# Patient Record
Sex: Female | Born: 1995
Health system: Southern US, Community
[De-identification: ages and names within clinical notes are randomized; demographics above are authoritative.]

## PROBLEM LIST (undated history)

## (undated) DIAGNOSIS — F32A Depression, unspecified: Secondary | ICD-10-CM

## (undated) DIAGNOSIS — F419 Anxiety disorder, unspecified: Secondary | ICD-10-CM

## (undated) DIAGNOSIS — F329 Major depressive disorder, single episode, unspecified: Secondary | ICD-10-CM

## (undated) HISTORY — PX: TUMOR EXCISION: SHX421

---
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
## 2019-01-10 DIAGNOSIS — R5383 Other fatigue: Secondary | ICD-10-CM | POA: Diagnosis not present

## 2019-03-22 DIAGNOSIS — H5213 Myopia, bilateral: Secondary | ICD-10-CM | POA: Diagnosis not present

## 2019-06-09 DIAGNOSIS — Z20828 Contact with and (suspected) exposure to other viral communicable diseases: Secondary | ICD-10-CM | POA: Diagnosis not present

## 2019-08-24 ENCOUNTER — Encounter: Payer: Self-pay | Admitting: Emergency Medicine

## 2019-08-24 DIAGNOSIS — R1032 Left lower quadrant pain: Secondary | ICD-10-CM | POA: Diagnosis not present

## 2019-08-24 DIAGNOSIS — R109 Unspecified abdominal pain: Secondary | ICD-10-CM | POA: Diagnosis not present

## 2019-08-24 LAB — URINALYSIS, ROUTINE W REFLEX MICROSCOPIC
Bilirubin Urine: NEGATIVE
Glucose, UA: NEGATIVE mg/dL
Hgb urine dipstick: NEGATIVE
Ketones, ur: NEGATIVE mg/dL
Leukocytes,Ua: NEGATIVE
Nitrite: NEGATIVE
Protein, ur: NEGATIVE mg/dL
Specific Gravity, Urine: 1.015 (ref 1.005–1.030)
pH: 7.5 (ref 5.0–8.0)

## 2019-08-24 LAB — CBC
HCT: 40.8 % (ref 36.0–46.0)
Hemoglobin: 14 g/dL (ref 12.0–15.0)
MCH: 28.7 pg (ref 26.0–34.0)
MCHC: 34.3 g/dL (ref 30.0–36.0)
MCV: 83.6 fL (ref 80.0–100.0)
Platelets: 240 10*3/uL (ref 150–400)
RBC: 4.88 MIL/uL (ref 3.87–5.11)
RDW: 12.5 % (ref 11.5–15.5)
WBC: 13.1 10*3/uL — ABNORMAL HIGH (ref 4.0–10.5)
nRBC: 0 % (ref 0.0–0.2)

## 2019-08-24 LAB — COMPREHENSIVE METABOLIC PANEL
ALT: 64 U/L — ABNORMAL HIGH (ref 0–44)
AST: 53 U/L — ABNORMAL HIGH (ref 15–41)
Albumin: 4.9 g/dL (ref 3.5–5.0)
Alkaline Phosphatase: 55 U/L (ref 38–126)
Anion gap: 12 (ref 5–15)
BUN: 9 mg/dL (ref 6–20)
CO2: 23 mmol/L (ref 22–32)
Calcium: 8.9 mg/dL (ref 8.9–10.3)
Chloride: 102 mmol/L (ref 98–111)
Creatinine, Ser: 0.56 mg/dL (ref 0.44–1.00)
GFR calc Af Amer: >60 mL/min (ref >60)
GFR calc non Af Amer: >60 mL/min (ref >60)
Glucose, Bld: 103 mg/dL — ABNORMAL HIGH (ref 70–99)
Potassium: 4 mmol/L (ref 3.5–5.1)
Sodium: 137 mmol/L (ref 135–145)
Total Bilirubin: 0.7 mg/dL (ref 0.3–1.2)
Total Protein: 7.6 g/dL (ref 6.5–8.1)

## 2019-08-24 LAB — PREGNANCY, URINE: Preg Test, Ur: NEGATIVE

## 2019-08-24 LAB — LIPASE, BLOOD: Lipase: 23 U/L (ref 11–51)

## 2019-08-24 MED ORDER — SODIUM CHLORIDE 0.9% FLUSH
3.0000 mL | Freq: Once | INTRAVENOUS | Status: DC
  Filled 2019-08-24: qty 3

## 2019-08-24 NOTE — ED Triage Notes (Signed)
Pt is c/o lower abd pain x 1 week  Pt states the pain is worse tonight  Pt states the pain comes in waves  Pt has had 3 episodes of vomiting tonight

## 2019-08-25 ENCOUNTER — Ambulatory Visit (HOSPITAL_BASED_OUTPATIENT_CLINIC_OR_DEPARTMENT_OTHER)
Admit: 2019-08-25 | Discharge: 2019-08-25 | Disposition: A | Discharge status: Home / Self Care | Payer: BC Managed Care – PPO | Attending: Emergency Medicine | Admitting: Emergency Medicine

## 2019-08-25 ENCOUNTER — Other Ambulatory Visit: Payer: Self-pay

## 2019-08-25 ENCOUNTER — Emergency Department (HOSPITAL_BASED_OUTPATIENT_CLINIC_OR_DEPARTMENT_OTHER)
Admission: EM | Admit: 2019-08-25 | Discharge: 2019-08-25 | Disposition: A | Discharge status: Home / Self Care | Payer: BC Managed Care – PPO | Attending: Emergency Medicine | Admitting: Emergency Medicine

## 2019-08-25 DIAGNOSIS — R1032 Left lower quadrant pain: Secondary | ICD-10-CM | POA: Diagnosis not present

## 2019-08-25 HISTORY — DX: Depression, unspecified: F32.A

## 2019-08-25 HISTORY — DX: Anxiety disorder, unspecified: F41.9

## 2019-08-25 MED ORDER — ONDANSETRON 4 MG PO TBDP
8.0000 mg | ORAL_TABLET | Freq: Once | ORAL | Status: AC
  Administered 2019-08-25: 8 mg via ORAL
  Filled 2019-08-25: qty 2

## 2019-08-25 MED ORDER — ONDANSETRON 8 MG PO TBDP: 8.0000 mg | ORAL_TABLET | Freq: Three times a day (TID) | ORAL | 0 refills | Status: DC | PRN

## 2019-08-25 NOTE — ED Provider Notes (Signed)
In the  [facility] Provider Note: Crystal Dell, MD, FACEP  CSN: [id number] MRN: [id number] ARRIVAL: 08/24/19 at 2242 ROOM: MH04/MH04   CHIEF COMPLAINT  Abdominal Pain   HISTORY OF PRESENT ILLNESS  08/25/19 3:22 AM Crystal Marshall is a 24 y.o. female who has had lower abdominal pain for about a week.  The pain is sometimes dull and sometimes all the way across her lower abdomen.  She describes it as being constant but crampy in nature.  Presently it is more per pronounced on the left side.  It is worse with movement or palpation.  She describes it as a soreness as well.  She has had associated nausea, worse when she eats or drinks, and has had 3 episodes of vomiting.  She denies diarrhea, dysuria, hematuria, vaginal bleeding or discharge.   Past Medical History:  Diagnosis Date  . Anxiety   . Depression     History reviewed. No pertinent surgical history.  Family History  Problem Relation Age of Onset  . Hypertension Mother   . Diabetes Father   . Hypertension Father   . Hyperlipidemia Father   . Diabetes Other     Social History   Tobacco Use  . Smoking status: Never Smoker  . Smokeless tobacco: Never Used  Substance Use Topics  . Alcohol use: Yes  . Drug use: Yes    Types: Marijuana    Prior to Admission medications   Medication Sig Start Date End Date Taking? Authorizing Provider  ondansetron (ZOFRAN ODT) 8 MG disintegrating tablet Take 1 tablet (8 mg total) by mouth every 8 (eight) hours as needed for nausea or vomiting. 08/25/19   Desteny Freeman, Jonny Ruiz, MD    Allergies Patient has no known allergies.   REVIEW OF SYSTEMS  Negative except as noted here or in the History of Present Illness.   PHYSICAL EXAMINATION  Initial Vital Signs Blood pressure 126/79, pulse 98, temperature 98.3 F (36.8 C), temperature source Oral, resp. rate 18, height 5' 4.5" (1.638 m), weight 93 kg, last menstrual period 08/10/2019, SpO2 99 %.  Examination General:  Well-developed, well-nourished female in no acute distress; appearance consistent with age of record HENT: normocephalic; atraumatic Eyes: pupils equal, round and reactive to light; extraocular muscles intact Neck: supple Heart: regular rate and rhythm Lungs: clear to auscultation bilaterally Abdomen: soft; nondistended; left lower quadrant tenderness; bowel sounds present Extremities: No deformity; full range of motion; pulses normal Neurologic: Awake, alert and oriented; motor function intact in all extremities and symmetric; no facial droop Skin: Warm and dry Psychiatric: Normal mood and affect   RESULTS  Summary of this visit's results, reviewed and interpreted by myself:   EKG Interpretation  Date/Time:    Ventricular Rate:    PR Interval:    QRS Duration:   QT Interval:    QTC Calculation:   R Axis:     Text Interpretation:        Laboratory Studies: Results for orders placed or performed during the hospital encounter of 08/25/19 (from the past 24 hour(s))  Lipase, blood     Status: None   Collection Time: 08/24/19 11:09 PM  Result Value Ref Range   Lipase 23 11 - 51 U/L  Comprehensive metabolic panel     Status: Abnormal   Collection Time: 08/24/19 11:09 PM  Result Value Ref Range   Sodium 137 135 - 145 mmol/L   Potassium 4.0 3.5 - 5.1 mmol/L   Chloride 102 98 - 111 mmol/L  CO2 23 22 - 32 mmol/L   Glucose, Bld 103 (H) 70 - 99 mg/dL   BUN 9 6 - 20 mg/dL   Creatinine, Ser 0.56 0.44 - 1.00 mg/dL   Calcium 8.9 8.9 - 10.3 mg/dL   Total Protein 7.6 6.5 - 8.1 g/dL   Albumin 4.9 3.5 - 5.0 g/dL   AST 53 (H) 15 - 41 U/L   ALT 64 (H) 0 - 44 U/L   Alkaline Phosphatase 55 38 - 126 U/L   Total Bilirubin 0.7 0.3 - 1.2 mg/dL   GFR calc non Af Amer >60 >60 mL/min   GFR calc Af Amer >60 >60 mL/min   Anion gap 12 5 - 15  CBC     Status: Abnormal   Collection Time: 08/24/19 11:09 PM  Result Value Ref Range   WBC 13.1 (H) 4.0 - 10.5 K/uL   RBC 4.88 3.87 - 5.11 MIL/uL    Hemoglobin 14.0 12.0 - 15.0 g/dL   HCT 40.8 36.0 - 46.0 %   MCV 83.6 80.0 - 100.0 fL   MCH 28.7 26.0 - 34.0 pg   MCHC 34.3 30.0 - 36.0 g/dL   RDW 12.5 11.5 - 15.5 %   Platelets 240 150 - 400 K/uL   nRBC 0.0 0.0 - 0.2 %  Urinalysis, Routine w reflex microscopic     Status: None   Collection Time: 08/24/19 11:09 PM  Result Value Ref Range   Color, Urine YELLOW YELLOW   APPearance CLEAR CLEAR   Specific Gravity, Urine 1.015 1.005 - 1.030   pH 7.5 5.0 - 8.0   Glucose, UA NEGATIVE NEGATIVE mg/dL   Hgb urine dipstick NEGATIVE NEGATIVE   Bilirubin Urine NEGATIVE NEGATIVE   Ketones, ur NEGATIVE NEGATIVE mg/dL   Protein, ur NEGATIVE NEGATIVE mg/dL   Nitrite NEGATIVE NEGATIVE   Leukocytes,Ua NEGATIVE NEGATIVE  Pregnancy, urine     Status: None   Collection Time: 08/24/19 11:09 PM  Result Value Ref Range   Preg Test, Ur NEGATIVE NEGATIVE   Imaging Studies: No results found.  ED COURSE and MDM  Nursing notes, initial and subsequent vitals signs, including pulse oximetry, reviewed and interpreted by myself.  Vitals:   08/24/19 2256 08/24/19 2257 08/25/19 0133  BP: (!) 114/91  126/79  Pulse: 96  98  Resp: 18  18  Temp: 98.9 F (37.2 C)  98.3 F (36.8 C)  TempSrc: Oral  Oral  SpO2: 100%  99%  Weight:  93 kg   Height:  5' 4.5" (1.638 m)    Medications  ondansetron (ZOFRAN-ODT) disintegrating tablet 8 mg (has no administration in time range)    The differential diagnosis of left lower quadrant pain in a young female is heavily weighted towards the reproductive organs.  We will start with a pelvic ultrasound later this morning.  If this does not yield an answer we may proceed to CT scan but the patient was advised that we wish to avoid radiation in a patient her age.  She does have a remote history of diverticulitis, which is a typical for her age, but states her current pain is not like that of previous diverticulitis.  She is comfortable with this current plan.  PROCEDURES    Procedures   ED DIAGNOSES     ICD-10-CM   1. Left lower quadrant abdominal pain  R10.32        Shanon Rosser, MD 08/25/19 [phone]

## 2019-08-25 NOTE — ED Provider Notes (Signed)
Patient had a follow-up ultrasound per Dr. Read Drivers' order from last night's visit.  Results reviewed with patient.  Left ovarian cyst identified on ultrasound.  Review of chart indicates this is consistent with patient's pain pattern.  Patient is counseled that she must have follow-up with GYN.  She is advised to use her discharge instructions with referral number or go to [facility] health website.  Return precautions reviewed for worsening pain, fever, other concerning symptoms.  Recommendations made for over-the-counter Tylenol or ibuprofen for pain control.  Marland Kitchen   Arby Barrette, MD 08/25/19 [phone]

## 2019-09-27 ENCOUNTER — Telehealth (HOSPITAL_COMMUNITY): Payer: BC Managed Care – PPO | Admitting: Psychiatry

## 2019-09-27 ENCOUNTER — Other Ambulatory Visit: Payer: Self-pay

## 2019-09-27 ENCOUNTER — Encounter (HOSPITAL_COMMUNITY): Payer: Self-pay | Admitting: Psychiatry

## 2019-09-27 ENCOUNTER — Telehealth (INDEPENDENT_AMBULATORY_CARE_PROVIDER_SITE_OTHER): Payer: BC Managed Care – PPO | Admitting: Psychiatry

## 2019-09-27 DIAGNOSIS — F411 Generalized anxiety disorder: Secondary | ICD-10-CM | POA: Diagnosis not present

## 2019-09-27 DIAGNOSIS — F330 Major depressive disorder, recurrent, mild: Secondary | ICD-10-CM

## 2019-09-27 MED ORDER — FLUVOXAMINE MALEATE 100 MG PO TABS: 100.0000 mg | ORAL_TABLET | Freq: Every day | ORAL | 1 refills | Status: DC

## 2019-09-27 MED ORDER — LAMOTRIGINE 200 MG PO TABS: 200.0000 mg | ORAL_TABLET | Freq: Every day | ORAL | 1 refills | Status: DC

## 2019-09-27 MED ORDER — MELATONIN 5 MG PO CAPS: 5.0000 mg | ORAL_CAPSULE | Freq: Every day | ORAL | 1 refills | Status: DC

## 2019-09-27 NOTE — Progress Notes (Signed)
Psychiatric Initial Adult Assessment  Virtual Visit via Video Note  I connected with Crystal Marshall on 09/27/19 at  3:00 PM EDT by a video enabled telemedicine application and verified that I am speaking with the correct person using two identifiers.  Location: Patient: Home Provider: Clinic   I discussed the limitations of evaluation and management by telemedicine and the availability of in person appointments. The patient expressed understanding and agreed to proceed.  I provided 40 minutes of non-face-to-face time during this encounter.    Patient Identification: Crystal Marshall MRN:  [id number] Date of Evaluation:  09/27/2019 Referral Source: Beverly Sessions  Chief Complaint: "I feel depressed"     Visit Diagnosis: No diagnosis found.  History of Present Illness:  24 year old female was seen today for initial psychiatric evaluation. Patient was referred to outpatient psychiatry by [facility]. She has a psychiatic history of anxiety and depression. She is currently prescribed Lamictal 200 mg and melatonin 5 mg. Today she reports that she has been feeling depressed. She endorses depressed mood most days, weight gain, and crying. Currently she is on her menstrual cycle and reports that she cries at times and notes that her mood fluctuates.  She reports at times she feels distractible and impulsive.  She reports that 2 weeks ago she stayed up all night cleaning and at times is unable to complete task because of distractibility.  She denies grandiosity, flight of ideas, sleep disturbances, or excessive talking.  She reports that her medications are effective in managing her mood at this time and did not request medication adjustments.  She states that she has had a lot of stressors to occur this month including her cat being sick and the semester ending. 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 She  reports that she and her professors will make surveys for local tobacco vendors to determine it they would consider selling Nicotine Replacement Therapy (NRT).  In August she reports that she will start a new semester on campus.  She believes that this may be a stressful time and reports that she will consider starting therapy to help her cope with increased stressors.  No other concerns noted at this time.  Associated Signs/Symptoms: Depression Symptoms:  depressed mood, weight gain, (Hypo) Manic Symptoms:  Distractibility, Flight of Ideas, Impulsivity, Anxiety Symptoms:  Denies Psychotic Symptoms:  Denies PTSD Symptoms: NA  Past Psychiatric History: Anxiety and depression   Previous Psychotropic Medications: Yes   Substance Abuse History in the last 12 months:  No.  Consequences of Substance Abuse: NA  Past Medical History:  Past Medical History:  Diagnosis Date  . Anxiety   . Depression    No past surgical history on file.  Family Psychiatric History: Unknown  Family History:  Family History  Problem Relation Age of Onset  . Hypertension Mother   . Diabetes Father   . Hypertension Father   . Hyperlipidemia Father   . Diabetes Other     Social History:   Social History   Socioeconomic History  . Marital status: Single    Spouse name: Not on file  . Number of children: Not on file  . Years of education: Not on file  . Highest education level: Not on file  Occupational History  . Not on file  Tobacco Use  . Smoking status: Never Smoker  . Smokeless tobacco: Never Used  Substance and Sexual Activity  .  Alcohol use: Yes  . Drug use: Yes    Types: Marijuana  . Sexual activity: Yes    Birth control/protection: Pill  Other Topics Concern  . Not on file  Social History Narrative  . Not on file   Social Determinants of Health   Financial Resource Strain:   . Difficulty of Paying Living Expenses:   Food Insecurity:   . Worried About Programme researcher, broadcasting/film/video  in the Last Year:   . Barista in the Last Year:   Transportation Needs:   . Freight forwarder (Medical):   Marland Kitchen Lack of Transportation (Non-Medical):   Physical Activity:   . Days of Exercise per Week:   . Minutes of Exercise per Session:   Stress:   . Feeling of Stress :   Social Connections:   . Frequency of Communication with Friends and Family:   . Frequency of Social Gatherings with Friends and Family:   . Attends Religious Services:   . Active Member of Clubs or Organizations:   . 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  She is working on obtaining a degree in Nurse, mental health.  Currently she has no children.  She denies alcohol and illicit drug use.  Allergies:  No Known Allergies  Metabolic Disorder Labs: No results found for: HGBA1C, MPG No results found for: PROLACTIN No results found for: CHOL, TRIG, HDL, CHOLHDL, VLDL, LDLCALC No results found for: TSH  Therapeutic Level Labs: No results found for: LITHIUM No results found for: CBMZ No results found for: VALPROATE  Current Medications: Current Outpatient Medications  Medication Sig Dispense Refill  . ondansetron (ZOFRAN ODT) 8 MG disintegrating tablet Take 1 tablet (8 mg total) by mouth every 8 (eight) hours as needed for nausea or vomiting. 10 tablet 0   No current facility-administered medications for this visit.    Musculoskeletal: Strength & Muscle Tone: Unable to assess due to telehealth visit Gait & Station: Unable to assess due to telehealth visit Patient leans: Right  Psychiatric Specialty Exam: Review of Systems  There were no vitals taken for this visit.There is no height or weight on file to calculate BMI.  General Appearance: Well Groomed  Eye Contact:  Good  Speech:  Clear and Coherent and Normal Rate  Volume:  Normal  Mood:  Depressed  Affect:  Congruent  Thought Process:   Coherent and Linear  Orientation:  Full (Time, Place, and Person)  Thought Content:  Logical  Suicidal Thoughts:  No  Homicidal Thoughts:  No  Memory:  Immediate;   Good Recent;   Good Remote;   Good  Judgement:  Good  Insight:  Good  Psychomotor Activity:  Normal  Concentration:  Concentration: Good and Attention Span: Good  Recall:  Good  Fund of Knowledge:Good  Language: Good  Akathisia:  No  Handed:  Right  AIMS (if indicated):  not done  Assets:  Communication Skills Desire for Improvement Financial Resources/Insurance Housing Vocational/Educational  ADL's:  Intact  Cognition: WNL  Sleep:  Poor   Screenings:   Assessment and Plan: Patient reports that she is feeling depressed. She however reports that her medications are effective at managing her depressive symptoms and would not like medications adjusted at this time. She is agreeable to continue medications as prescribed.  1. Mild episode of recurrent major depressive disorder (HCC)  Continue- fluvoxaMINE (LUVOX) 100  MG tablet; Take 1 tablet (100 mg total) by mouth daily.  Dispense: 30 tablet; Refill: 1 Continue- lamoTRIgine (LAMICTAL) 200 MG tablet; Take 1 tablet (200 mg total) by mouth daily.  Dispense: 30 tablet; Refill: 1 Continue- Melatonin 5 MG CAPS; Take 1 capsule (5 mg total) by mouth at bedtime.  Dispense: 30 capsule; Refill: 1  2. Generalized anxiety disorder Continue- fluvoxaMINE (LUVOX) 100 MG tablet; Take 1 tablet (100 mg total) by mouth daily.  Dispense: 30 tablet; Refill: 1  Follow up in 2 months     Shanna Cisco, NP 6/2/20212:59 PM

## 2019-10-24 DIAGNOSIS — L680 Hirsutism: Secondary | ICD-10-CM | POA: Diagnosis not present

## 2019-10-24 DIAGNOSIS — N83202 Unspecified ovarian cyst, left side: Secondary | ICD-10-CM | POA: Diagnosis not present

## 2019-10-24 DIAGNOSIS — N83292 Other ovarian cyst, left side: Secondary | ICD-10-CM | POA: Diagnosis not present

## 2019-11-28 ENCOUNTER — Encounter (HOSPITAL_COMMUNITY): Payer: Self-pay | Admitting: Psychiatry

## 2019-11-28 ENCOUNTER — Other Ambulatory Visit: Payer: Self-pay

## 2019-11-28 ENCOUNTER — Telehealth (INDEPENDENT_AMBULATORY_CARE_PROVIDER_SITE_OTHER): Payer: BC Managed Care – PPO | Admitting: Psychiatry

## 2019-11-28 DIAGNOSIS — F3181 Bipolar II disorder: Secondary | ICD-10-CM | POA: Diagnosis not present

## 2019-11-28 DIAGNOSIS — F411 Generalized anxiety disorder: Secondary | ICD-10-CM | POA: Diagnosis not present

## 2019-11-28 MED ORDER — LAMOTRIGINE 25 MG PO TABS: 25.0000 mg | ORAL_TABLET | Freq: Every day | ORAL | 1 refills | Status: DC

## 2019-11-28 MED ORDER — FLUVOXAMINE MALEATE 100 MG PO TABS: 100.0000 mg | ORAL_TABLET | Freq: Every day | ORAL | 1 refills | Status: DC

## 2019-11-28 NOTE — Progress Notes (Signed)
BH MD/PA/NP OP Progress Note Virtual Visit via Video Note  I connected with Crystal Marshall on 11/28/19 at 10:00 AM EDT by a video enabled telemedicine application and verified that I am speaking with the correct person using two identifiers.  Location: Patient: Home Provider: Clinic   I discussed the limitations of evaluation and management by telemedicine and the availability of in person appointments. The patient expressed understanding and agreed to proceed.  I provided 30 minutes of non-face-to-face time during this encounter.     11/28/2019 10:38 AM Crystal Marshall  MRN:  [id number]  Chief Complaint: "I'm having a bad morning today."  HPI: Patient seen today for follow-up psychiatric evaluation and medication management.  She has a history of bipolar 2 and anxiety. She is currently prescribed Lamictal 200 mg and melatonin 5 mg.  Patient reports that she has been off of her Lamictal medication for 3 weeks and notes she no longer takes her melatonin.    Today she reports that she tapered herself off of Lamictal medication after reading about side effects online and finding a pink flaky area on her ankle.  She reports that she was nervous that it was a sign of Stevens-Johnson syndrome, so she decided to stop taking the medication.  Provider reviewed side effect profile with the patient today and noted signes of Viviann Spare Johnsons syndrome.  Patient verbalizes understanding.   Today, patient endorses an increase of depressive symptoms since stopping her Lamictal medication.  Her symptoms include crying spells, irritability, distractibility, depressed mood, and intrusive thoughts about her boyfriend of 6 years leaving her.  She endorses adequate sleep and denies any SI/HI/AVH.  She reports that she is having a bad morning today because she woke up feeling depressed but she didn't tell her boyfriend.  He asked for her assistance to help him with a bread route and she agreed to help  him even though she wasn't feeling well.  She reports that while she was helping him, she started to have a crying spell and had to go home.  She feels like her boyfriend may be becoming annoyed with her and her crying spells.  She reports that her Lamictal was effective in managing her mood and she feels "crazy" for stopping her medication now.  Writer provided emotional support to the patient and reassured her that Lamictal medication can be restarted at it's lowest dose.  Patient is agreeable to restart Lamictal medication at this time.  Lamictal will be restarted at 25 mg for 2 weeks, then will increase to 50 mg for 2 weeks, and then 75 mg for 2 weeks.  Patient reports that she is currently interested in restarting therapy at this time.  Referral will be placed today for outpatient therapy.  Patient is agreeable to continue all other medications as prescribed.  Patient denies any other concerns at this time and is agreeable with this plan of care.  Visit Diagnosis:    ICD-10-CM   1. Bipolar 2 disorder, major depressive episode (HCC)  F31.81 lamoTRIgine (LAMICTAL) 25 MG tablet    Ambulatory referral to Social Work  2. Generalized anxiety disorder  F41.1 fluvoxaMINE (LUVOX) 100 MG tablet    Past Psychiatric History: Anxiety and depression   Past Medical History:  Past Medical History:  Diagnosis Date  . Anxiety   . Depression    No past surgical history on file.  Family Psychiatric History: Unknown  Family History:  Family History  Problem Relation Age of Onset  .  Hypertension Mother   . Diabetes Father   . Hypertension Father   . Hyperlipidemia Father   . Diabetes Other     Social History:  Social History   Socioeconomic History  . Marital status: Media planner    Spouse name: Not on file  . Number of children: Not on file  . Years of education: Not on file  . Highest education level: Not on file  Occupational History  . Not on file  Tobacco Use  . Smoking status:  Never Smoker  . Smokeless tobacco: Never Used  Vaping Use  . Vaping Use: Never used  Substance and Sexual Activity  . Alcohol use: Yes  . Drug use: Yes    Types: Marijuana  . Sexual activity: Yes    Birth control/protection: Pill  Other Topics Concern  . Not on file  Social History Narrative  . Not on file   Social Determinants of Health   Financial Resource Strain:   . Difficulty of Paying Living Expenses:   Food Insecurity:   . Worried About Programme researcher, broadcasting/film/video in the Last Year:   . Barista in the Last Year:   Transportation Needs:   . Freight forwarder (Medical):   Marland Kitchen Lack of Transportation (Non-Medical):   Physical Activity:   . Days of Exercise per Week:   . Minutes of Exercise per Session:   Stress:   . Feeling of Stress :   Social Connections:   . Frequency of Communication with Friends and Family:   . Frequency of Social Gatherings with Friends and Family:   . Attends Religious Services:   . Active Member of Clubs or Organizations:   . Attends Banker Meetings:   Marland Kitchen Marital Status:     Allergies: No Known Allergies  Metabolic Disorder Labs: No results found for: HGBA1C, MPG No results found for: PROLACTIN No results found for: CHOL, TRIG, HDL, CHOLHDL, VLDL, LDLCALC No results found for: TSH  Therapeutic Level Labs: No results found for: LITHIUM No results found for: VALPROATE No components found for:  CBMZ  Current Medications: Current Outpatient Medications  Medication Sig Dispense Refill  . fluvoxaMINE (LUVOX) 100 MG tablet Take 1 tablet (100 mg total) by mouth daily. 30 tablet 1  . lamoTRIgine (LAMICTAL) 25 MG tablet Take 1 tablet (25 mg total) by mouth daily. 126 tablet 1  . ondansetron (ZOFRAN ODT) 8 MG disintegrating tablet Take 1 tablet (8 mg total) by mouth every 8 (eight) hours as needed for nausea or vomiting. 10 tablet 0   No current facility-administered medications for this visit.      Musculoskeletal: Strength & Muscle Tone: Unable to assess via telehealth visit Gait & Station: Unable to assess via telehealth visit Patient leans: N/A   Psychiatric Specialty Exam: Review of Systems  There were no vitals taken for this visit.There is no height or weight on file to calculate BMI.  General Appearance: Well Groomed  Eye Contact:  Good  Speech:  Clear and Coherent and Normal Rate  Volume:  Normal  Mood:  Depressed  Affect:  Congruent, Depressed and Tearful  Thought Process:  Coherent, Goal Directed and Linear  Orientation:  Full (Time, Place, and Person)  Thought Content: WDL and Logical   Suicidal Thoughts:  No  Homicidal Thoughts:  No  Memory:  Immediate;   Good Recent;   Good Remote;   Good  Judgement:  Fair  Insight:  Good  Psychomotor Activity:  Unable  to assess via telehealth visit  Concentration:  Concentration: Good and Attention Span: Good  Recall:  Good  Fund of Knowledge: Good  Language: Good  Akathisia:  No  Handed:  Right  AIMS (if indicated): Not done  Assets:  Communication Skills Desire for Improvement Financial Resources/Insurance Housing Social Support  ADL's:  Intact  Cognition: WNL  Sleep:  Good    Assessment and Plan: Patient endorses an increase in depressive symptoms and crying spells recently.  She reports that the Lamictal was effective for managing her mood.  However, she made the provider aware that she tapered herself off of Lamictal medication after reading about side effects online and finding a flaky area on her ankle.  She has been off of the medication for 3 weeks.  Provider reviewed side effect profile and signs Stevens-Johnson syndrome noting that it usually affects mucous membranes first.  Patient reports understanding and she is agreeable to restart Lamictal medication. Lamictal will be restarted at 25 mg for 2 weeks, then will increase to 50 mg for 2 weeks, and then 75 mg for 2 weeks.  Patient is also interested in  restarting therapy at this time.  Referral will be placed today for outpatient therapy.  Patient is agreeable to continue all other medications as prescribed.   1. Generalized anxiety disorder  -Continue fluvoxaMINE (LUVOX) 100 MG tablet; Take 1 tablet (100 mg total) by mouth daily.  Dispense: 30 tablet; Refill: 1  2. Bipolar 2 disorder, major depressive episode (HCC)  -Restart lamoTRIgine (LAMICTAL) 25 MG tablet; Take 1 tablet (25 mg total) by mouth daily.  Dispense: 126 tablet; Refill: 1 - Ambulatory referral to Social Work    Follow-up in 6 weeks. Referral placed for outpatient therapy.  Shanna Cisco, NP 11/28/2019, 10:38 AM

## 2020-01-04 ENCOUNTER — Other Ambulatory Visit: Payer: Self-pay

## 2020-01-04 ENCOUNTER — Ambulatory Visit (HOSPITAL_COMMUNITY): Payer: BC Managed Care – PPO | Admitting: Clinical

## 2020-01-23 ENCOUNTER — Telehealth (INDEPENDENT_AMBULATORY_CARE_PROVIDER_SITE_OTHER): Payer: BC Managed Care – PPO | Admitting: Psychiatry

## 2020-01-23 ENCOUNTER — Encounter (HOSPITAL_COMMUNITY): Payer: Self-pay | Admitting: Psychiatry

## 2020-01-23 ENCOUNTER — Other Ambulatory Visit: Payer: Self-pay

## 2020-01-23 DIAGNOSIS — F411 Generalized anxiety disorder: Secondary | ICD-10-CM | POA: Diagnosis not present

## 2020-01-23 DIAGNOSIS — F3181 Bipolar II disorder: Secondary | ICD-10-CM

## 2020-01-23 MED ORDER — BUSPIRONE HCL 10 MG PO TABS: 10.0000 mg | ORAL_TABLET | Freq: Three times a day (TID) | ORAL | 2 refills | Status: DC

## 2020-01-23 MED ORDER — LAMOTRIGINE 100 MG PO TABS: 100.0000 mg | ORAL_TABLET | Freq: Every day | ORAL | 2 refills | Status: DC

## 2020-01-23 MED ORDER — FLUVOXAMINE MALEATE 100 MG PO TABS: 100.0000 mg | ORAL_TABLET | Freq: Every day | ORAL | 2 refills | Status: DC

## 2020-01-23 NOTE — Progress Notes (Signed)
BH MD/PA/NP OP Progress Note Virtual Visit via Video Note  I connected with Crystal Marshall on 01/23/20 at  3:00 PM EDT by a video enabled telemedicine application and verified that I am speaking with the correct person using two identifiers.  Location: Patient: Home Provider: Clinic   I discussed the limitations of evaluation and management by telemedicine and the availability of in person appointments. The patient expressed understanding and agreed to proceed.  I provided 30 minutes of non-face-to-face time during this encounter.     01/23/2020 1:07 PM Crystal Marshall  MRN:  [id number]  Chief Complaint: "I have been stressed out about school".  HPI: Patient seen today for follow-up psychiatric evaluation and medication management.  She has a history of bipolar 2 disorder, depression, and anxiety. She is currently prescribed Lamictal 75 mg, and Luvox 100 mg. She notes that her medications are somewhat effective in managing her psychiatric conditions.  Today she is well groomed, pleasant, cooperative, engaged in conversation, and maintained eye contact. She notes that her mood is depressed however states that she is handling it well. She endorses irritability and distractibility (noting that she loses track of time and miss out on things). She denies grandiosity, racing thoughts, liable mood, poor sleep, or impulsive behaviors. She informed provider that since restarting school her anxiety has increased. She notes that she recently restarted school in person and notes that she becomes anxious during class. She also informed provider that she is stressed because she has to find an internship by December regarding tobacco cessation. She informed provider that when overly anxious she has passive suicidal thoughts. She contracts for safety today and denies SI/HI/VAH or paranoia. She endorses adequate sleep and appetite.    Patient notes that hydroxyzine was ineffective in the past  because it was over sedating.She is agreeable to start Buspar 10 mg three times daily to help manage anxiety. She is also agreeable to increase Lamictal 75 mg to 100 mg to help manage mood. She will continue all other medications as prescribed. Potential side effects of medication and risks vs benefits of treatment vs non-treatment were explained and discussed. All questions were answered. No other concerns noted at this time.     Visit Diagnosis:    ICD-10-CM   1. Generalized anxiety disorder  F41.1 busPIRone (BUSPAR) 10 MG tablet    fluvoxaMINE (LUVOX) 100 MG tablet  2. Bipolar 2 disorder, major depressive episode (HCC)  F31.81 lamoTRIgine (LAMICTAL) 100 MG tablet    Past Psychiatric History: Anxiety and depression   Past Medical History:  Past Medical History:  Diagnosis Date  . Anxiety   . Depression    History reviewed. No pertinent surgical history.  Family Psychiatric History: Unknown  Family History:  Family History  Problem Relation Age of Onset  . Hypertension Mother   . Diabetes Father   . Hypertension Father   . Hyperlipidemia Father   . Diabetes Other     Social History:  Social History   Socioeconomic History  . Marital status: Media planner    Spouse name: Not on file  . Number of children: Not on file  . Years of education: Not on file  . Highest education level: Not on file  Occupational History  . Not on file  Tobacco Use  . Smoking status: Never Smoker  . Smokeless tobacco: Never Used  Vaping Use  . Vaping Use: Never used  Substance and Sexual Activity  . Alcohol use: Yes  . Drug use:  Yes    Types: Marijuana  . Sexual activity: Yes    Birth control/protection: Pill  Other Topics Concern  . Not on file  Social History Narrative  . Not on file   Social Determinants of Health   Financial Resource Strain:   . Difficulty of Paying Living Expenses: Not on file  Food Insecurity:   . Worried About Programme researcher, broadcasting/film/video in the Last Year: Not  on file  . Ran Out of Food in the Last Year: Not on file  Transportation Needs:   . Lack of Transportation (Medical): Not on file  . Lack of Transportation (Non-Medical): Not on file  Physical Activity:   . Days of Exercise per Week: Not on file  . Minutes of Exercise per Session: Not on file  Stress:   . Feeling of Stress : Not on file  Social Connections:   . Frequency of Communication with Friends and Family: Not on file  . Frequency of Social Gatherings with Friends and Family: Not on file  . Attends Religious Services: Not on file  . Active Member of Clubs or Organizations: Not on file  . Attends Banker Meetings: Not on file  . Marital Status: Not on file    Allergies: No Known Allergies  Metabolic Disorder Labs: No results found for: HGBA1C, MPG No results found for: PROLACTIN No results found for: CHOL, TRIG, HDL, CHOLHDL, VLDL, LDLCALC No results found for: TSH  Therapeutic Level Labs: No results found for: LITHIUM No results found for: VALPROATE No components found for:  CBMZ  Current Medications: Current Outpatient Medications  Medication Sig Dispense Refill  . ondansetron (ZOFRAN ODT) 8 MG disintegrating tablet Take 1 tablet (8 mg total) by mouth every 8 (eight) hours as needed for nausea or vomiting. 10 tablet 0  . busPIRone (BUSPAR) 10 MG tablet Take 1 tablet (10 mg total) by mouth 3 (three) times daily. 90 tablet 2  . fluvoxaMINE (LUVOX) 100 MG tablet Take 1 tablet (100 mg total) by mouth daily. 30 tablet 2  . lamoTRIgine (LAMICTAL) 100 MG tablet Take 1 tablet (100 mg total) by mouth daily. 30 tablet 2   No current facility-administered medications for this visit.     Musculoskeletal: Strength & Muscle Tone: Unable to assess via telehealth visit Gait & Station: Unable to assess via telehealth visit Patient leans: N/A   Psychiatric Specialty Exam: Review of Systems  There were no vitals taken for this visit.There is no height or weight on  file to calculate BMI.  General Appearance: Well Groomed  Eye Contact:  Good  Speech:  Clear and Coherent and Normal Rate  Volume:  Normal  Mood:  Depressed and Euthymic  Affect:  Congruent  Thought Process:  Coherent, Goal Directed and Linear  Orientation:  Full (Time, Place, and Person)  Thought Content: WDL and Logical   Suicidal Thoughts:  No  Homicidal Thoughts:  No  Memory:  Immediate;   Good Recent;   Good Remote;   Good  Judgement:  Fair  Insight:  Good  Psychomotor Activity:  Normal  Concentration:  Concentration: Good and Attention Span: Good  Recall:  Good  Fund of Knowledge: Good  Language: Good  Akathisia:  No  Handed:  Right  AIMS (if indicated): Not done  Assets:  Communication Skills Desire for Improvement Financial Resources/Insurance Housing Social Support  ADL's:  Intact  Cognition: WNL  Sleep:  Good    Assessment and Plan: Patient notes that her depression  has improved however notes that she is anxious, distractible, and irritable. She is agreeable to start Buspar 10 mg three times daily to help manage anxiety. She is also agreeable to increase Lamictal 75 mg to 100mg  to help manage mood.  1. Generalized anxiety disorder  Start- busPIRone (BUSPAR) 10 MG tablet; Take 1 tablet (10 mg total) by mouth 3 (three) times daily.  Dispense: 90 tablet; Refill: 2 Contiinue- fluvoxaMINE (LUVOX) 100 MG tablet; Take 1 tablet (100 mg total) by mouth daily.  Dispense: 30 tablet; Refill: 2  2. Bipolar 2 disorder, major depressive episode (HCC)  Increased- lamoTRIgine (LAMICTAL) 100 MG tablet; Take 1 tablet (100 mg total) by mouth daily.  Dispense: 30 tablet; Refill: 2   Follow up in 3 months Follow up with therapy    Follow-up in 6 weeks. Referral placed for outpatient therapy.  , NP 01/23/2020, 1:07 PM

## 2020-03-20 DIAGNOSIS — B373 Candidiasis of vulva and vagina: Secondary | ICD-10-CM | POA: Diagnosis not present

## 2020-04-24 ENCOUNTER — Encounter (HOSPITAL_COMMUNITY): Payer: Self-pay | Admitting: Psychiatry

## 2020-04-24 ENCOUNTER — Other Ambulatory Visit: Payer: Self-pay

## 2020-04-24 ENCOUNTER — Telehealth (INDEPENDENT_AMBULATORY_CARE_PROVIDER_SITE_OTHER): Payer: BC Managed Care – PPO | Admitting: Psychiatry

## 2020-04-24 DIAGNOSIS — F3181 Bipolar II disorder: Secondary | ICD-10-CM

## 2020-04-24 DIAGNOSIS — F411 Generalized anxiety disorder: Secondary | ICD-10-CM | POA: Diagnosis not present

## 2020-04-24 MED ORDER — MELATONIN 10 MG PO CAPS: 10.0000 mg | ORAL_CAPSULE | Freq: Every evening | ORAL | 2 refills | Status: DC

## 2020-04-24 MED ORDER — BUSPIRONE HCL 15 MG PO TABS: 15.0000 mg | ORAL_TABLET | Freq: Three times a day (TID) | ORAL | 2 refills | Status: DC

## 2020-04-24 MED ORDER — LAMOTRIGINE 100 MG PO TABS: 100.0000 mg | ORAL_TABLET | Freq: Every day | ORAL | 2 refills | Status: DC

## 2020-04-24 MED ORDER — FLUVOXAMINE MALEATE 100 MG PO TABS: 100.0000 mg | ORAL_TABLET | Freq: Every day | ORAL | 2 refills | Status: DC

## 2020-04-24 NOTE — Progress Notes (Signed)
BH MD/PA/NP OP Progress Note Virtual Visit via Video Note  I connected with Crystal Marshall on 04/24/20 at  9:30 AM EST by a video enabled telemedicine application and verified that I am speaking with the correct person using two identifiers.  Location: Patient: Home Provider: Clinic   I discussed the limitations of evaluation and management by telemedicine and the availability of in person appointments. The patient expressed understanding and agreed to proceed.  I provided 30 minutes of non-face-to-face time during this encounter.     04/24/2020 9:33 AM Crystal Marshall  MRN:  [id number]  Chief Complaint: "I think i'm managing well".  HPI: 24 year old female seen today for follow-up psychiatric evaluation and medication management.  She has a history of bipolar 2 disorder, depression, and anxiety. She is currently prescribed Lamictal 100 mg, buspar 10 mg three times daily, and Luvox 100 mg. She notes that her medications are somewhat effective in managing her psychiatric conditions but notes that she has been having disturbed sleep.  Today she is well groomed, pleasant, cooperative, engaged in conversation, and maintained eye contact. She informed provider that she has been managing well and notes that her depression has improved. 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 She notes that she has been taking Delta 8 to help manage her sleep and anxiety. She notes that because of this THC showed up in her UDS. She also informed provider that she has been anxious because she and her boyfriend may have Covid. Today she denied SI/HI/VAH, mania, or paranoia. She informed provider that her mood has also been stable on her current dose of Lamictal. She endorses increased appetite and weight. She notes that since stopping delta 8 she now uses Melatonin 5 mg which has been somewhat  effective in managing her sleep. She notes she sleeps 6 hours nightly but at times wakes up.   Patient asked provider to assess her for ADHD. Patient endorses 3 symptoms of ADHD but not the required 6 as stated in the [facility]. She endorses avoidance of mentally taxing task, forgetfulness, and being disorganized. Provider informed patient that anxiety and depressions can worsen concentration and focus. She endorsed understanding. Today she scored a 16 on her GAD 7 and a 9 on her PHQ 9.   She is agreeable to increase Buspar 10 mg three times daily to 15 mg three times daily help manage anxiety. She is also agreeable to increase Melatonin 5 mg to 10 mg to help manage sleep. If sleep does not improve patient may start trazodone 25-50 mg at next visit. She will continue all other medications as prescribed. Potential side effects of medication and risks vs benefits of treatment vs non-treatment were explained and discussed. All questions were answered. No other concerns noted at this time.     Visit Diagnosis:    ICD-10-CM   1. Generalized anxiety disorder  F41.1 busPIRone (BUSPAR) 15 MG tablet    fluvoxaMINE (LUVOX) 100 MG tablet  2. Bipolar 2 disorder, major depressive episode (HCC)  F31.81 lamoTRIgine (LAMICTAL) 100 MG tablet    Melatonin 10 MG CAPS    Past Psychiatric History: Anxiety and depression   Past Medical History:  Past Medical History:  Diagnosis Date  . Anxiety   . Depression    History reviewed. No pertinent surgical history.  Family Psychiatric History: Unknown  Family History:  Family History  Problem Relation Age of Onset  . Hypertension Mother   . Diabetes Father   . Hypertension Father   . Hyperlipidemia Father   . Diabetes Other     Social History:  Social History   Socioeconomic History  . Marital status: Media planner    Spouse name: Not on file  . Number of children: Not on file  . Years of education: Not on file  . Highest education level: Not on file   Occupational History  . Not on file  Tobacco Use  . Smoking status: Never Smoker  . Smokeless tobacco: Never Used  Vaping Use  . Vaping Use: Never used  Substance and Sexual Activity  . Alcohol use: Yes  . Drug use: Yes    Types: Marijuana  . Sexual activity: Yes    Birth control/protection: Pill  Other Topics Concern  . Not on file  Social History Narrative  . Not on file   Social Determinants of Health   Financial Resource Strain: Not on file  Food Insecurity: Not on file  Transportation Needs: Not on file  Physical Activity: Not on file  Stress: Not on file  Social Connections: Not on file    Allergies: No Known Allergies  Metabolic Disorder Labs: No results found for: HGBA1C, MPG No results found for: PROLACTIN No results found for: CHOL, TRIG, HDL, CHOLHDL, VLDL, LDLCALC No results found for: TSH  Therapeutic Level Labs: No results found for: LITHIUM No results found for: VALPROATE No components found for:  CBMZ  Current Medications: Current Outpatient Medications  Medication Sig Dispense Refill  . Melatonin 10 MG CAPS Take 10 mg by mouth at bedtime. 30 capsule 2  . busPIRone (BUSPAR) 15 MG tablet Take 1 tablet (15 mg total) by mouth 3 (three) times daily. 90 tablet 2  . fluvoxaMINE (LUVOX) 100 MG tablet Take 1 tablet (100 mg total) by mouth daily. 30 tablet 2  . lamoTRIgine (LAMICTAL) 100 MG tablet Take 1 tablet (100 mg total) by mouth daily. 30 tablet 2  . ondansetron (ZOFRAN ODT) 8 MG disintegrating tablet Take 1 tablet (8 mg total) by mouth every 8 (eight) hours as needed for nausea or vomiting. 10 tablet 0   No current facility-administered medications for this visit.     Musculoskeletal: Strength & Muscle Tone: Unable to assess via telehealth visit Gait & Station: Unable to assess via telehealth visit Patient leans: N/A   Psychiatric Specialty Exam: Review of Systems  There were no vitals taken for this visit.There is no height or weight on  file to calculate BMI.  General Appearance: Well Groomed  Eye Contact:  Good  Speech:  Clear and Coherent and Normal Rate  Volume:  Normal  Mood:  Anxious and Euthymic  Affect:  Congruent  Thought Process:  Coherent, Goal Directed and Linear  Orientation:  Full (Time, Place, and Person)  Thought Content: WDL and Logical   Suicidal Thoughts:  No  Homicidal Thoughts:  No  Memory:  Immediate;   Good Recent;   Good Remote;   Good  Judgement:  Good  Insight:  Good  Psychomotor Activity:  Normal  Concentration:  Concentration: Good and Attention Span: Good  Recall:  Good  Fund of Knowledge: Good  Language: Good  Akathisia:  No  Handed:  Right  AIMS (if indicated): Not done  Assets:  Communication Skills Desire for Improvement Financial Resources/Insurance Housing Social Support  ADL's:  Intact  Cognition: WNL  Sleep:  Good  Assessment and Plan: Patient notes that her depression has improved however notes that she is anxious due to life stressors. She is agreeable to increase Buspar 10 mg three times daily to 15 mg three times daily help manage anxiety. She is also agreeable to increase Melatonin 5 mg to 10 mg to help manage sleep. If sleep does not improve patient may start trazodone 25-50 mg at next visit. She will continue all other medications as prescribed.   1. Generalized anxiety disorder  Increased- busPIRone (BUSPAR) 15 MG tablet; Take 1 tablet (15 mg total) by mouth 3 (three) times daily.  Dispense: 90 tablet; Refill: 2 Continue- fluvoxaMINE (LUVOX) 100 MG tablet; Take 1 tablet (100 mg total) by mouth daily.  Dispense: 30 tablet; Refill: 2  2. Bipolar 2 disorder, major depressive episode (HCC)  Continue- lamoTRIgine (LAMICTAL) 100 MG tablet; Take 1 tablet (100 mg total) by mouth daily.  Dispense: 30 tablet; Refill: 2 Start/Increased- Melatonin 10 MG CAPS; Take 10 mg by mouth at bedtime.  Dispense: 30 capsule; Refill: 2   Follow up in 3 months Follow up with  therapy     Shanna Cisco, NP 04/24/2020, 9:33 AM

## 2020-05-06 ENCOUNTER — Other Ambulatory Visit: Payer: Self-pay

## 2020-05-06 ENCOUNTER — Telehealth (HOSPITAL_COMMUNITY): Payer: Self-pay | Admitting: Clinical

## 2020-05-06 ENCOUNTER — Ambulatory Visit (HOSPITAL_COMMUNITY): Payer: BC Managed Care – PPO | Admitting: Clinical

## 2020-05-06 NOTE — Telephone Encounter (Signed)
Therapist sent the client a text message link from [facility] for the scheduled virtual therapy visit. Client did not check in using the link.Therapist followed up with a phone call to the client. Therapist left a voice mail for the client to call back and reschedule the appointment.

## 2020-07-23 ENCOUNTER — Other Ambulatory Visit: Payer: Self-pay

## 2020-07-23 ENCOUNTER — Telehealth (HOSPITAL_COMMUNITY): Payer: BC Managed Care – PPO | Admitting: Psychiatry

## 2020-08-21 ENCOUNTER — Other Ambulatory Visit: Payer: Self-pay

## 2020-08-21 ENCOUNTER — Encounter (HOSPITAL_COMMUNITY): Payer: Self-pay | Admitting: Psychiatry

## 2020-08-21 ENCOUNTER — Telehealth (INDEPENDENT_AMBULATORY_CARE_PROVIDER_SITE_OTHER): Payer: BC Managed Care – PPO | Admitting: Psychiatry

## 2020-08-21 DIAGNOSIS — F3181 Bipolar II disorder: Secondary | ICD-10-CM | POA: Diagnosis not present

## 2020-08-21 DIAGNOSIS — F411 Generalized anxiety disorder: Secondary | ICD-10-CM | POA: Diagnosis not present

## 2020-08-21 MED ORDER — MELATONIN 10 MG PO CAPS: 10.0000 mg | ORAL_CAPSULE | Freq: Every evening | ORAL | 2 refills | Status: DC

## 2020-08-21 MED ORDER — LAMOTRIGINE 150 MG PO TABS: 150.0000 mg | ORAL_TABLET | Freq: Every day | ORAL | 2 refills | Status: DC

## 2020-08-21 MED ORDER — TRAZODONE HCL 50 MG PO TABS: 50.0000 mg | ORAL_TABLET | Freq: Every evening | ORAL | 2 refills | Status: DC | PRN

## 2020-08-21 MED ORDER — FLUVOXAMINE MALEATE 100 MG PO TABS: 100.0000 mg | ORAL_TABLET | Freq: Every day | ORAL | 2 refills | Status: DC

## 2020-08-21 NOTE — Progress Notes (Signed)
BH MD/PA/NP OP Progress Note Virtual Visit via Video Note  I connected with Crystal Marshall on 08/21/20 at 11:30 AM EDT by a video enabled telemedicine application and verified that I am speaking with the correct person using two identifiers.  Location: Patient: Home Provider: Clinic   I discussed the limitations of evaluation and management by telemedicine and the availability of in person appointments. The patient expressed understanding and agreed to proceed.  I provided 30 minutes of non-face-to-face time during this encounter.     08/21/2020 11:43 AM Crystal Marshall  MRN:  [id number]  Chief Complaint: "Buspar has med me more angry".  HPI: 25 year old female seen today for follow-up psychiatric evaluation and medication management.  She has a history of bipolar 2 disorder, depression, and anxiety. She is currently prescribed Lamictal 100 mg, buspar 15 mg three times daily, and Luvox 100 mg. She notes that her medications are somewhat effective in managing her psychiatric conditions.   Today she is well groomed, pleasant, cooperative, engaged in conversation, and maintained eye contact. She informed that since increasing BuSpar she felt more overwhelmed and angry.  She also notes that it made her feel dizzy.  Provider informed patient that BuSpar generally does not cause irritability but could cause some dizziness.  She also notes that she has been going through a lot of life changes.  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  She informed Clinical research associate that she is nervous about being in the workforce as she has been in school for over 6 years.  She informed Clinical research associate that she is proud of herself because she is done things that her parents did not.  She also notes that she is concerned about finances, her car which is older, and her father who is moving into a camper after getting a divorce.  Today provider  conducted a GAD-7 and patient scored a 13, at her last visit she scored a 16.  Provider also conducted a PHQ-9 and patient scored a 12, at her last visit she scored a 9.  Patient notes that during her menstrual cycle earlier this month she had passive SI however notes that she relied on her family and did not harm herself.  Provider instructed informed patient that [facility] is open 24 hours if she feels that she is a danger to herself or others.  She endorsed understanding. Today she denies SI/HI/VAH or paranoia.  Patient does endorse symptoms of hypomania such as distractibility, irritability, racing thoughts, and fluctuations in her mood.  She also endorses poor sleep noting that she sleeps approximately 4 to 5 hours nightly.  Patient notes that her appetite is increased however notes that her weight remains stable.    Today she is agreeable to discontinuing BuSpar 15 mg 3 times daily.  She will start trazodone 25 mg to 50 mg as needed for sleep.  She will increase Lamictal 100 mg to 150 mg to help manage mood.  Luvox may be increased at next visit if patient's anxiety and depression worsen. Potential side effects of medication and risks vs benefits of treatment vs non-treatment were explained and discussed. All questions were answered. No other concerns noted at this time.     Visit Diagnosis:    ICD-10-CM   1. Generalized anxiety disorder  F41.1 traZODone (DESYREL) 50 MG tablet    fluvoxaMINE (LUVOX) 100 MG tablet  2. Bipolar 2 disorder, major depressive episode (HCC)  F31.81 traZODone (DESYREL) 50 MG tablet    lamoTRIgine (LAMICTAL) 150 MG tablet    Melatonin 10 MG CAPS    Past Psychiatric History: Anxiety and depression   Past Medical History:  Past Medical History:  Diagnosis Date  . Anxiety   . Depression    History reviewed. No pertinent surgical history.  Family Psychiatric History: Unknown  Family History:  Family History  Problem Relation Age of Onset  . Hypertension Mother    . Diabetes Father   . Hypertension Father   . Hyperlipidemia Father   . Diabetes Other     Social History:  Social History   Socioeconomic History  . Marital status: Media planner    Spouse name: Not on file  . Number of children: Not on file  . Years of education: Not on file  . Highest education level: Not on file  Occupational History  . Not on file  Tobacco Use  . Smoking status: Never Smoker  . Smokeless tobacco: Never Used  Vaping Use  . Vaping Use: Never used  Substance and Sexual Activity  . Alcohol use: Yes  . Drug use: Yes    Types: Marijuana  . Sexual activity: Yes    Birth control/protection: Pill  Other Topics Concern  . Not on file  Social History Narrative  . Not on file   Social Determinants of Health   Financial Resource Strain: Not on file  Food Insecurity: Not on file  Transportation Needs: Not on file  Physical Activity: Not on file  Stress: Not on file  Social Connections: Not on file    Allergies: No Known Allergies  Metabolic Disorder Labs: No results found for: HGBA1C, MPG No results found for: PROLACTIN No results found for: CHOL, TRIG, HDL, CHOLHDL, VLDL, LDLCALC No results found for: TSH  Therapeutic Level Labs: No results found for: LITHIUM No results found for: VALPROATE No components found for:  CBMZ  Current Medications: Current Outpatient Medications  Medication Sig Dispense Refill  . traZODone (DESYREL) 50 MG tablet Take 1 tablet (50 mg total) by mouth at bedtime as needed for sleep. 30 tablet 2  . fluvoxaMINE (LUVOX) 100 MG tablet Take 1 tablet (100 mg total) by mouth daily. 30 tablet 2  . lamoTRIgine (LAMICTAL) 150 MG tablet Take 1 tablet (150 mg total) by mouth daily. 30 tablet 2  . Melatonin 10 MG CAPS Take 10 mg by mouth at bedtime. 30 capsule 2  . ondansetron (ZOFRAN ODT) 8 MG disintegrating tablet Take 1 tablet (8 mg total) by mouth every 8 (eight) hours as needed for nausea or vomiting. 10 tablet 0   No  current facility-administered medications for this visit.     Musculoskeletal: Strength & Muscle Tone: Unable to assess via telehealth visit Gait & Station: Unable to assess via telehealth visit Patient leans: N/A   Psychiatric Specialty Exam: Review of Systems  There were no vitals taken for this visit.There is no height or weight on file to calculate BMI.  General Appearance: Well Groomed  Eye Contact:  Good  Speech:  Clear and Coherent and Normal Rate  Volume:  Normal  Mood:  Anxious and Depressed  Affect:  Congruent  Thought Process:  Coherent, Goal Directed and Linear  Orientation:  Full (Time, Place, and Person)  Thought Content: WDL and Logical   Suicidal Thoughts:  No  Homicidal Thoughts:  No  Memory:  Immediate;   Good Recent;   Good Remote;   Good  Judgement:  Good  Insight:  Good  Psychomotor Activity:  Normal  Concentration:  Concentration: Good and Attention Span: Good  Recall:  Good  Fund of Knowledge: Good  Language: Good  Akathisia:  No  Handed:  Right  AIMS (if indicated): Not done  Assets:  Communication Skills Desire for Improvement Financial Resources/Insurance Housing Social Support  ADL's:  Intact  Cognition: WNL  Sleep:  Fair    Assessment and Plan: Patient endorses symptoms of anxiety, depression, insomnia, and hypomania.Today she is agreeable to discontinuing BuSpar 15 mg 3 times daily as she notes it makes her dizzy and angry.  She will start trazodone 25 mg to 50 mg as needed for sleep.  She will increase Lamictal 100 mg to 150 mg to help manage mood.  Luvox may be increased at next visit if patient's anxiety and depression worsen.  1. Generalized anxiety disorder  Start- traZODone (DESYREL) 50 MG tablet; Take 1 tablet (50 mg total) by mouth at bedtime as needed for sleep.  Dispense: 30 tablet; Refill: 2 Continue- fluvoxaMINE (LUVOX) 100 MG tablet; Take 1 tablet (100 mg total) by mouth daily.  Dispense: 30 tablet; Refill: 2  2. Bipolar 2  disorder, major depressive episode (HCC)  Start- traZODone (DESYREL) 50 MG tablet; Take 1 tablet (50 mg total) by mouth at bedtime as needed for sleep.  Dispense: 30 tablet; Refill: 2 Increase- lamoTRIgine (LAMICTAL) 150 MG tablet; Take 1 tablet (150 mg total) by mouth daily.  Dispense: 30 tablet; Refill: 2 Continue- Melatonin 10 MG CAPS; Take 10 mg by mouth at bedtime.  Dispense: 30 capsule; Refill: 2    Follow up in 3 months Follow up with therapy     Shanna Cisco, NP 08/21/2020, 11:43 AM

## 2020-09-14 DIAGNOSIS — Z3200 Encounter for pregnancy test, result unknown: Secondary | ICD-10-CM | POA: Diagnosis not present

## 2020-09-14 DIAGNOSIS — R5383 Other fatigue: Secondary | ICD-10-CM | POA: Diagnosis not present

## 2020-09-14 DIAGNOSIS — Z0000 Encounter for general adult medical examination without abnormal findings: Secondary | ICD-10-CM | POA: Diagnosis not present

## 2020-09-14 DIAGNOSIS — R590 Localized enlarged lymph nodes: Secondary | ICD-10-CM | POA: Diagnosis not present

## 2020-09-23 DIAGNOSIS — R590 Localized enlarged lymph nodes: Secondary | ICD-10-CM | POA: Diagnosis not present

## 2020-09-23 DIAGNOSIS — Z6834 Body mass index (BMI) 34.0-34.9, adult: Secondary | ICD-10-CM | POA: Diagnosis not present

## 2020-11-08 ENCOUNTER — Other Ambulatory Visit (HOSPITAL_COMMUNITY): Payer: Self-pay | Admitting: Psychiatry

## 2020-11-08 ENCOUNTER — Telehealth (HOSPITAL_COMMUNITY): Payer: Self-pay | Admitting: Psychiatry

## 2020-11-08 DIAGNOSIS — F3181 Bipolar II disorder: Secondary | ICD-10-CM

## 2020-11-08 DIAGNOSIS — F411 Generalized anxiety disorder: Secondary | ICD-10-CM

## 2020-11-08 MED ORDER — MELATONIN 10 MG PO CAPS: 10.0000 mg | ORAL_CAPSULE | Freq: Every evening | ORAL | 2 refills | Status: DC

## 2020-11-08 MED ORDER — FLUVOXAMINE MALEATE 100 MG PO TABS: 100.0000 mg | ORAL_TABLET | Freq: Every day | ORAL | 2 refills | Status: DC

## 2020-11-08 MED ORDER — LAMOTRIGINE 150 MG PO TABS: 150.0000 mg | ORAL_TABLET | Freq: Every day | ORAL | 2 refills | Status: DC

## 2020-11-08 MED ORDER — TRAZODONE HCL 50 MG PO TABS: 50.0000 mg | ORAL_TABLET | Freq: Every evening | ORAL | 2 refills | Status: DC | PRN

## 2020-11-08 NOTE — Telephone Encounter (Signed)
Medications reordered and sent to preferred pharmacy.  

## 2020-11-08 NOTE — Telephone Encounter (Signed)
Patient was contacted per provider to reschedule. Patient was agreeable to next available appt on 8/31. Please send medication refills to bridge until next appt.

## 2020-11-20 ENCOUNTER — Telehealth (HOSPITAL_COMMUNITY): Payer: BC Managed Care – PPO | Admitting: Psychiatry

## 2020-11-23 DIAGNOSIS — H5213 Myopia, bilateral: Secondary | ICD-10-CM | POA: Diagnosis not present

## 2020-12-25 ENCOUNTER — Encounter (HOSPITAL_COMMUNITY): Payer: Self-pay | Admitting: Psychiatry

## 2020-12-25 ENCOUNTER — Telehealth (INDEPENDENT_AMBULATORY_CARE_PROVIDER_SITE_OTHER): Payer: No Payment, Other | Admitting: Psychiatry

## 2020-12-25 ENCOUNTER — Other Ambulatory Visit: Payer: Self-pay

## 2020-12-25 DIAGNOSIS — F411 Generalized anxiety disorder: Secondary | ICD-10-CM | POA: Diagnosis not present

## 2020-12-25 DIAGNOSIS — F3181 Bipolar II disorder: Secondary | ICD-10-CM

## 2020-12-25 MED ORDER — FLUVOXAMINE MALEATE 100 MG PO TABS: 100.0000 mg | ORAL_TABLET | Freq: Every day | ORAL | 3 refills | Status: DC

## 2020-12-25 MED ORDER — TRAZODONE HCL 50 MG PO TABS: 50.0000 mg | ORAL_TABLET | Freq: Every evening | ORAL | 3 refills | Status: DC | PRN

## 2020-12-25 MED ORDER — ARIPIPRAZOLE 5 MG PO TABS: 5.0000 mg | ORAL_TABLET | Freq: Every day | ORAL | 3 refills | Status: DC

## 2020-12-25 NOTE — Progress Notes (Signed)
BH MD/PA/NP OP Progress Note Virtual Visit via Video Note  I connected with Crystal Marshall on 12/25/20 at  4:00 PM EDT by a video enabled telemedicine application and verified that I am speaking with the correct person using two identifiers.  Location: Patient: Work Provider: Clinic   I discussed the limitations of evaluation and management by telemedicine and the availability of in person appointments. The patient expressed understanding and agreed to proceed.  I provided 30 minutes of non-face-to-face time during this encounter.     12/25/2020 4:46 PM Crystal Marshall  MRN:  [id number]  Chief Complaint: "I have not been taking the Lamictal".  HPI: 25 year old female seen today for follow-up psychiatric evaluation and medication management.  She has a history of bipolar 2 disorder, depression, and anxiety. She is currently prescribed Lamictal 150 mg, melatonin 10 mg, and Luvox 100 mg. She notes that her medications are somewhat effective in managing her psychiatric conditions.   Today she is well groomed, pleasant, cooperative, engaged in conversation, and maintained eye contact. She informed that she discontinued her Lamictal over a week ago.  She informed Clinical research associate that she has difficulties remembering to take Lamictal although she found that effective.  Since discontinuing her medication she notes that she has been more irritable, distractible, and having racing thoughts.  She also informed writer that her anxiety and depression has somewhat worsened.  Provider conducted a GAD-7 and patient scored a 16, at her last visit she scored a 13.  Provider also conducted a PHQ-9 and patient scored a 10, at her last visit she scored a 12.  Provider asked patient how she felt about any long-acting injectable.  She notes that she may consider it in the future however prefers oral medications at this time.  Today she denies SI/HI/VAH or paranoia.  She endorses hypersomnia noting that she  sleeps 8 to 10 hours.  She also informed writer that her appetite has increased and reports that she is gained 10 pounds since her last visit.  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  She notes that she finds enjoyment in her job.  Today she is agreeable to starting Abilify 5 mg to help manage mood and symptoms of hypomania.  She will continue Luvox as prescribed.  At this time Lamictal not restarted.  Patient also notes that she would like to discontinue melatonin.  No other concerns noted at this time.     Visit Diagnosis:    ICD-10-CM   1. Generalized anxiety disorder  F41.1 fluvoxaMINE (LUVOX) 100 MG tablet    traZODone (DESYREL) 50 MG tablet    2. Bipolar 2 disorder, major depressive episode (HCC)  F31.81 ARIPiprazole (ABILIFY) 5 MG tablet    traZODone (DESYREL) 50 MG tablet      Past Psychiatric History: Anxiety and depression   Past Medical History:  Past Medical History:  Diagnosis Date   Anxiety    Depression    History reviewed. No pertinent surgical history.  Family Psychiatric History: Unknown  Family History:  Family History  Problem Relation Age of Onset   Hypertension Mother    Diabetes Father    Hypertension Father    Hyperlipidemia Father    Diabetes Other     Social History:  Social History   Socioeconomic History   Marital status: Media planner    Spouse name: Not on file   Number of  children: Not on file   Years of education: Not on file   Highest education level: Not on file  Occupational History   Not on file  Tobacco Use   Smoking status: Never   Smokeless tobacco: Never  Vaping Use   Vaping Use: Never used  Substance and Sexual Activity   Alcohol use: Yes   Drug use: Yes    Types: Marijuana   Sexual activity: Yes    Birth control/protection: Pill  Other Topics Concern   Not on file  Social History Narrative   Not on file   Social  Determinants of Health   Financial Resource Strain: Not on file  Food Insecurity: Not on file  Transportation Needs: Not on file  Physical Activity: Not on file  Stress: Not on file  Social Connections: Not on file    Allergies: No Known Allergies  Metabolic Disorder Labs: No results found for: HGBA1C, MPG No results found for: PROLACTIN No results found for: CHOL, TRIG, HDL, CHOLHDL, VLDL, LDLCALC No results found for: TSH  Therapeutic Level Labs: No results found for: LITHIUM No results found for: VALPROATE No components found for:  CBMZ  Current Medications: Current Outpatient Medications  Medication Sig Dispense Refill   ARIPiprazole (ABILIFY) 5 MG tablet Take 1 tablet (5 mg total) by mouth daily. 30 tablet 3   fluvoxaMINE (LUVOX) 100 MG tablet Take 1 tablet (100 mg total) by mouth daily. 30 tablet 3   ondansetron (ZOFRAN ODT) 8 MG disintegrating tablet Take 1 tablet (8 mg total) by mouth every 8 (eight) hours as needed for nausea or vomiting. 10 tablet 0   traZODone (DESYREL) 50 MG tablet Take 1 tablet (50 mg total) by mouth at bedtime as needed for sleep. 30 tablet 3   No current facility-administered medications for this visit.     Musculoskeletal: Strength & Muscle Tone:  Unable to assess via telehealth visit Gait & Station:  Unable to assess via telehealth visit Patient leans: N/A   Psychiatric Specialty Exam: Review of Systems  There were no vitals taken for this visit.There is no height or weight on file to calculate BMI.  General Appearance: Well Groomed  Eye Contact:  Good  Speech:  Clear and Coherent and Normal Rate  Volume:  Normal  Mood:  Anxious and Depressed  Affect:  Congruent  Thought Process:  Coherent, Goal Directed and Linear  Orientation:  Full (Time, Place, and Person)  Thought Content: WDL and Logical   Suicidal Thoughts:  No  Homicidal Thoughts:  No  Memory:  Immediate;   Good Recent;   Good Remote;   Good  Judgement:  Good   Insight:  Good  Psychomotor Activity:  Normal  Concentration:  Concentration: Good and Attention Span: Good  Recall:  Good  Fund of Knowledge: Good  Language: Good  Akathisia:  No  Handed:  Right  AIMS (if indicated): Not done  Assets:  Communication Skills Desire for Improvement Financial Resources/Insurance Housing Social Support  ADL's:  Intact  Cognition: WNL  Sleep:  Fair    Assessment and Plan: Patient endorses symptoms of anxiety, depression, and hypomania.Today she is agreeable to starting Abilify 5 mg to help manage mood and symptoms of hypomania.  She will continue Luvox as prescribed.  At this time Lamictal not restarted.  Patient also notes that she would like to discontinue melatonin.   1. Generalized anxiety disorder  Continue- fluvoxaMINE (LUVOX) 100 MG tablet; Take 1 tablet (100 mg total) by mouth daily.  Dispense: 30 tablet; Refill: 3 Continue- traZODone (DESYREL) 50 MG tablet; Take 1 tablet (50 mg total) by mouth at bedtime as needed for sleep.  Dispense: 30 tablet; Refill: 3  2. Bipolar 2 disorder, major depressive episode (HCC)  Start- ARIPiprazole (ABILIFY) 5 MG tablet; Take 1 tablet (5 mg total) by mouth daily.  Dispense: 30 tablet; Refill: 3 Continue- traZODone (DESYREL) 50 MG tablet; Take 1 tablet (50 mg total) by mouth at bedtime as needed for sleep.  Dispense: 30 tablet; Refill: 3    Follow up in 3 months Follow up with therapy     Shanna Cisco, NP 12/25/2020, 4:46 PM

## 2021-04-02 ENCOUNTER — Encounter (HOSPITAL_COMMUNITY): Payer: Self-pay | Admitting: Psychiatry

## 2021-04-02 ENCOUNTER — Telehealth (INDEPENDENT_AMBULATORY_CARE_PROVIDER_SITE_OTHER): Payer: No Payment, Other | Admitting: Psychiatry

## 2021-04-02 DIAGNOSIS — F3181 Bipolar II disorder: Secondary | ICD-10-CM

## 2021-04-02 DIAGNOSIS — F411 Generalized anxiety disorder: Secondary | ICD-10-CM

## 2021-04-02 MED ORDER — TRAZODONE HCL 50 MG PO TABS: 50.0000 mg | ORAL_TABLET | Freq: Every evening | ORAL | 3 refills | Status: DC | PRN

## 2021-04-02 MED ORDER — ARIPIPRAZOLE 5 MG PO TABS: 5.0000 mg | ORAL_TABLET | Freq: Every day | ORAL | 3 refills | Status: DC

## 2021-04-02 MED ORDER — FLUVOXAMINE MALEATE 100 MG PO TABS: 100.0000 mg | ORAL_TABLET | Freq: Every day | ORAL | 3 refills | Status: DC

## 2021-04-02 NOTE — Progress Notes (Signed)
BH MD/PA/NP OP Progress Note Virtual Visit via Video Note  I connected with Crystal Marshall on 04/02/21 at  4:00 PM EST by a video enabled telemedicine application and verified that I am speaking with the correct person using two identifiers.  Location: Patient: Work Provider: Clinic   I discussed the limitations of evaluation and management by telemedicine and the availability of in person appointments. The patient expressed understanding and agreed to proceed.  I provided 30 minutes of non-face-to-face time during this encounter.     04/02/2021 4:05 PM Crystal Marshall  MRN:  [id number]  Chief Complaint: "Everything has been going well".  HPI: 25 year old female seen today for follow-up psychiatric evaluation and medication management.  She has a history of bipolar 2 disorder, depression, and anxiety. She is currently prescribed Trazodone 50 mg nightly as needed, Abilify 5 mg daily, and Luvox 100 mg. She notes that her medications are effective in managing her psychiatric conditions.   Today she patients camera was turned off during visit. During exam she was pleasant, cooperative, and engaged in conversation. She informed provider that since starting Abilify things has been going well. At times she notes that she is tearful but reports that she is able to cope with it. She notes that her symptoms of mania has improved and are now manageable. Patient also notes that her anxiety and depression has gotten better. Today provider conducted a Gad 7 and patient scored a 7, at her last visit she scored a 16. Provider also conducted a PHQ 9 and patient scored a 5, at her last visit she scored a 10. She endorses adequate and notes that her appetite has increased. She informed Clinical research associate that she has gained 5 pounds since her last visit. Today she denies SI/HI/VAH, mania, or paranoia.   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      Overall patient notes that she is doing well. No medication changes made today. Patient agreeable to continue medications as prescribed. No other concerns noted at this time.     Visit Diagnosis:    ICD-10-CM   1. Bipolar 2 disorder, major depressive episode (HCC)  F31.81 ARIPiprazole (ABILIFY) 5 MG tablet    traZODone (DESYREL) 50 MG tablet    2. Generalized anxiety disorder  F41.1 fluvoxaMINE (LUVOX) 100 MG tablet    traZODone (DESYREL) 50 MG tablet      Past Psychiatric History: Anxiety and depression   Past Medical History:  Past Medical History:  Diagnosis Date   Anxiety    Depression    History reviewed. No pertinent surgical history.  Family Psychiatric History: Unknown  Family History:  Family History  Problem Relation Age of Onset   Hypertension Mother    Diabetes Father    Hypertension Father    Hyperlipidemia Father    Diabetes Other     Social History:  Social History   Socioeconomic History   Marital status: Media planner    Spouse name: Not on file   Number of children: Not on file   Years of education: Not on file   Highest education level: Not on file  Occupational History   Not on file  Tobacco Use   Smoking status: Never   Smokeless tobacco: Never  Vaping Use   Vaping Use: Never used  Substance and Sexual Activity   Alcohol use: Yes   Drug use: Yes    Types:  Marijuana   Sexual activity: Yes    Birth control/protection: Pill  Other Topics Concern   Not on file  Social History Narrative   Not on file   Social Determinants of Health   Financial Resource Strain: Not on file  Food Insecurity: Not on file  Transportation Needs: Not on file  Physical Activity: Not on file  Stress: Not on file  Social Connections: Not on file    Allergies: No Known Allergies  Metabolic Disorder Labs: No results found for: HGBA1C, MPG No results found for: PROLACTIN No results found for: CHOL, TRIG, HDL, CHOLHDL,  VLDL, LDLCALC No results found for: TSH  Therapeutic Level Labs: No results found for: LITHIUM No results found for: VALPROATE No components found for:  CBMZ  Current Medications: Current Outpatient Medications  Medication Sig Dispense Refill   ARIPiprazole (ABILIFY) 5 MG tablet Take 1 tablet (5 mg total) by mouth daily. 30 tablet 3   fluvoxaMINE (LUVOX) 100 MG tablet Take 1 tablet (100 mg total) by mouth daily. 30 tablet 3   ondansetron (ZOFRAN ODT) 8 MG disintegrating tablet Take 1 tablet (8 mg total) by mouth every 8 (eight) hours as needed for nausea or vomiting. 10 tablet 0   traZODone (DESYREL) 50 MG tablet Take 1 tablet (50 mg total) by mouth at bedtime as needed for sleep. 30 tablet 3   No current facility-administered medications for this visit.     Musculoskeletal: Strength & Muscle Tone:  Unable to assess via telehealth visit, camera turned off Gait & Station:  Unable to assess via telehealth visit Patient leans: N/A   Psychiatric Specialty Exam: Review of Systems  There were no vitals taken for this visit.There is no height or weight on file to calculate BMI.  General Appearance:   Unable to assess via telehealth visit, camera turned off  Eye Contact:    Unable to assess via telehealth visit, camera turned off  Speech:  Clear and Coherent and Normal Rate  Volume:  Normal  Mood:  Anxious and Depressed  Affect:  Congruent  Thought Process:  Coherent, Goal Directed and Linear  Orientation:  Full (Time, Place, and Person)  Thought Content: WDL and Logical   Suicidal Thoughts:  No  Homicidal Thoughts:  No  Memory:  Immediate;   Good Recent;   Good Remote;   Good  Judgement:  Good  Insight:  Good  Psychomotor Activity:    Unable to assess via telehealth visit, camera turned off  Concentration:  Concentration: Good and Attention Span: Good  Recall:  Good  Fund of Knowledge: Good  Language: Good  Akathisia:    Unable to assess via telehealth visit, camera turned  off  Handed:  Right  AIMS (if indicated): Not done  Assets:  Communication Skills Desire for Improvement Financial Resources/Insurance Housing Social Support  ADL's:  Intact  Cognition: WNL  Sleep:  Good    Assessment and Plan: Patient notes that her anxiety, depression, and sleep has improved since her last visit. No medication changes made today. Patient agreeable to continue medications as prescribed.    1. Generalized anxiety disorder  Continue- fluvoxaMINE (LUVOX) 100 MG tablet; Take 1 tablet (100 mg total) by mouth daily.  Dispense: 30 tablet; Refill: 3 Continue- traZODone (DESYREL) 50 MG tablet; Take 1 tablet (50 mg total) by mouth at bedtime as needed for sleep.  Dispense: 30 tablet; Refill: 3  2. Bipolar 2 disorder, major depressive episode (HCC)  Continue- ARIPiprazole (ABILIFY) 5 MG tablet; Take  1 tablet (5 mg total) by mouth daily.  Dispense: 30 tablet; Refill: 3 Continue- traZODone (DESYREL) 50 MG tablet; Take 1 tablet (50 mg total) by mouth at bedtime as needed for sleep.  Dispense: 30 tablet; Refill: 3    Follow up in 3 months Follow up with therapy     Shanna Cisco, NP 04/02/2021, 4:05 PM

## 2021-06-24 ENCOUNTER — Telehealth (HOSPITAL_COMMUNITY): Payer: No Payment, Other | Admitting: Psychiatry

## 2021-06-26 ENCOUNTER — Telehealth (INDEPENDENT_AMBULATORY_CARE_PROVIDER_SITE_OTHER): Payer: No Payment, Other | Admitting: Psychiatry

## 2021-06-26 DIAGNOSIS — F330 Major depressive disorder, recurrent, mild: Secondary | ICD-10-CM | POA: Diagnosis not present

## 2021-06-26 DIAGNOSIS — F411 Generalized anxiety disorder: Secondary | ICD-10-CM

## 2021-06-26 NOTE — Progress Notes (Signed)
BH MD/PA/NP OP Progress Note  06/26/2021 3:32 PM Crystal Marshall  MRN:  [id number] Virtual Visit via Telephone Note  I connected with Crystal Marshall on 06/26/21 at  3:00 PM EST by telephone and verified that I am speaking with the correct person using two identifiers.  Location: Patient: Museum/gallery conservator: offsite   I discussed the limitations, risks, security and privacy concerns of performing an evaluation and management service by telephone and the availability of in person appointments. I also discussed with the patient that there may be a patient responsible charge related to this service. The patient expressed understanding and agreed to proceed.      I discussed the assessment and treatment plan with the patient. The patient was provided an opportunity to ask questions and all were answered. The patient agreed with the plan and demonstrated an understanding of the instructions.   The patient was advised to call back or seek an in-person evaluation if the symptoms worsen or if the condition fails to improve as anticipated.  I provided 10 minutes of non-face-to-face time during this encounter.   Crystal Sober, NP   Chief Complaint: Medication management  HPI: Crystal Marshall is a 26 year old female presenting to [facility] behavioral health outpatient for follow-up psychiatric evaluation.  She has a psychiatric history of mouth depressive disorder and generalized anxiety disorder.  Her symptoms are managed with Abilify 5 mg daily, trazodone 50 mg at bedtime as needed for sleep, and Luvox 100 mg daily.  Patient reports medication noncompliance for 2 weeks due to nausea. She has restarted medications now and reports being placed on nausea medications by an alternate provider.. She d completed a pregnancy test which was negative.  She reports a good mood and feels like her symptoms are managed well with her medication regimen.  She denies need for dosage adjustments  today. Patient is alert and oriented x4, calm, and pleasant.  She is willing to engage.  Patient denies suicidal homicidal ideations, paranoia, delusions, auditory or visual hallucinations.   Visit Diagnosis:    ICD-10-CM   1. Mild episode of recurrent major depressive disorder (HCC)  F33.0     2. Generalized anxiety disorder  F41.1       Past Psychiatric History: Anxiety and depression  Past Medical History:  Past Medical History:  Diagnosis Date   Anxiety    Depression    No past surgical history on file.  Family Psychiatric History: No known  Family History:  Family History  Problem Relation Age of Onset   Hypertension Mother    Diabetes Father    Hypertension Father    Hyperlipidemia Father    Diabetes Other     Social History:  Social History   Socioeconomic History   Marital status: Media planner    Spouse name: Not on file   Number of children: Not on file   Years of education: Not on file   Highest education level: Not on file  Occupational History   Not on file  Tobacco Use   Smoking status: Never   Smokeless tobacco: Never  Vaping Use   Vaping Use: Never used  Substance and Sexual Activity   Alcohol use: Yes   Drug use: Yes    Types: Marijuana   Sexual activity: Yes    Birth control/protection: Pill  Other Topics Concern   Not on file  Social History Narrative   Not on file   Social Determinants of Health   Financial Resource Strain: Not  on file  Food Insecurity: Not on file  Transportation Needs: Not on file  Physical Activity: Not on file  Stress: Not on file  Social Connections: Not on file    Allergies: No Known Allergies  Metabolic Disorder Labs: No results found for: HGBA1C, MPG No results found for: PROLACTIN No results found for: CHOL, TRIG, HDL, CHOLHDL, VLDL, LDLCALC No results found for: TSH  Therapeutic Level Labs: No results found for: LITHIUM No results found for: VALPROATE No components found for:   CBMZ  Current Medications: Current Outpatient Medications  Medication Sig Dispense Refill   ARIPiprazole (ABILIFY) 5 MG tablet Take 1 tablet (5 mg total) by mouth daily. 30 tablet 3   fluvoxaMINE (LUVOX) 100 MG tablet Take 1 tablet (100 mg total) by mouth daily. 30 tablet 3   ondansetron (ZOFRAN ODT) 8 MG disintegrating tablet Take 1 tablet (8 mg total) by mouth every 8 (eight) hours as needed for nausea or vomiting. 10 tablet 0   traZODone (DESYREL) 50 MG tablet Take 1 tablet (50 mg total) by mouth at bedtime as needed for sleep. 30 tablet 3   No current facility-administered medications for this visit.     Musculoskeletal: Strength & Muscle Tone: N/A Gait & Station: N/A Patient leans: N/A  Psychiatric Specialty Exam: Review of Systems  Psychiatric/Behavioral:  Negative for hallucinations, self-injury and suicidal ideas.   All other systems reviewed and are negative.  There were no vitals taken for this visit.There is no height or weight on file to calculate BMI.  General Appearance: Well-groomed  Eye Contact: Good  Speech: Clear and coherent  Volume: Normal  Mood: Euthymic  Affect: Congruent  Thought Process: Goal directed  Orientation:  Full (Time, Place, and Person)  Thought Content: Logical   Suicidal Thoughts:  No  Homicidal Thoughts:  No  Memory: Good  Judgement: Good  Insight: Good  Psychomotor Activity: N/A  Concentration: Good  Recall: Good  Fund of Knowledge: Good  Language: Good  Akathisia: N/A  Handed: Right  AIMS (if indicated): Not done  Assets:  Communication Skills Desire for Improvement  ADL's:  Intact  Cognition: WNL  Sleep:  Good   Screenings: GAD-7    Flowsheet Row Video Visit from 04/02/2021 in [facility] Video Visit from 12/25/2020 in [facility] Video Visit from 08/21/2020 in [facility] Video Visit from 04/24/2020 in [facility]  Total GAD-7 Score 8 16 13 16       PHQ2-9    Flowsheet Row Video Visit from 04/02/2021 in [facility] Video Visit from 12/25/2020 in [facility] Video Visit from 08/21/2020 in [facility] Video Visit from 04/24/2020 in [facility]  PHQ-2 Total Score 1 3 3 3   PHQ-9 Total Score 6 10 12 9       Flowsheet Row Video Visit from 08/21/2020 in [facility]  C-SSRS RISK CATEGORY Error: Q7 should not be populated when Q6 is No        Assessment and Plan: Crystal Marshall is a 26 year old female presenting to [facility] behavioral health outpatient for follow-up psychiatric evaluation.  She has a psychiatric history of mouth depressive disorder and generalized anxiety disorder.  Her symptoms are managed with Abilify 5 mg daily, trazodone 50 mg at bedtime as needed for sleep, and Luvox 100 mg daily.  Patient reports medication noncompliance for 2 weeks  due to nausea. She has restarted medications now and reports being placed on nausea medications by an alternate provider.. She d completed a pregnancy test which was negative.  She reports a good mood and feels like her symptoms are managed well with her medication regimen.  She denies need for dosage adjustments today.  No dose adjustments or medication changes today.  M patient denies the need for refills at this time.  Collaboration of Care: Collaboration of Care: Medication Management AEB medications E scribed to patient's preferred pharmacy    ICD-10-CM   1. Mild episode of recurrent major depressive disorder (HCC)  F33.0     2. Generalized anxiety disorder  F41.1        Continue current medication regimen as ordered:  Outpatient Medications Prior to Visit  Medication Sig Dispense Refill   ARIPiprazole (ABILIFY) 5 MG tablet Take 1 tablet (5 mg total) by mouth daily. 30 tablet 3   fluvoxaMINE  (LUVOX) 100 MG tablet Take 1 tablet (100 mg total) by mouth daily. 30 tablet 3   ondansetron (ZOFRAN ODT) 8 MG disintegrating tablet Take 1 tablet (8 mg total) by mouth every 8 (eight) hours as needed for nausea or vomiting. 10 tablet 0   traZODone (DESYREL) 50 MG tablet Take 1 tablet (50 mg total) by mouth at bedtime as needed for sleep. 30 tablet 3   No facility-administered medications prior to visit.    Return to care in 3 months  Patient/Guardian was advised Release of Information must be obtained prior to any record release in order to collaborate their care with an outside provider. Patient/Guardian was advised if they have not already done so to contact the registration department to sign all necessary forms in order for [place] to release information regarding their care.   Consent: Patient/Guardian gives verbal consent for treatment and assignment of benefits for services provided during this visit. Patient/Guardian expressed understanding and agreed to proceed.    Crystal Sober, NP 06/26/2021, 3:32 PM

## 2021-08-13 ENCOUNTER — Telehealth (HOSPITAL_COMMUNITY): Payer: Self-pay | Admitting: Psychiatry

## 2021-08-13 NOTE — Telephone Encounter (Signed)
Patient had visit with C. Penn NP on 06/26/21.  Patient script from [facility] written 04/02/21 had 3 refills. ABILIFY 5 MG.     Patient reports more anxiety and requesting increase ABILIFY MG.  Please call pt [phone]

## 2021-09-22 IMAGING — US US PELVIS COMPLETE WITH TRANSVAGINAL
1 series · 13 of 25 positions shown · non-contrast
Comparison: None

CLINICAL DATA: Constant left lower quadrant pain for 1 week.



[Series 1: us pelvis complete with transvaginal · 124 acquisitions, 13 frames shown]
[im 1/124]
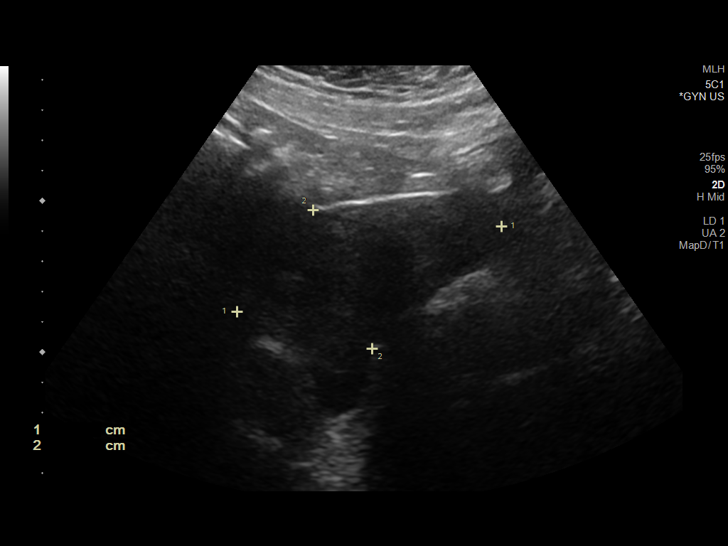
[im 11/124]
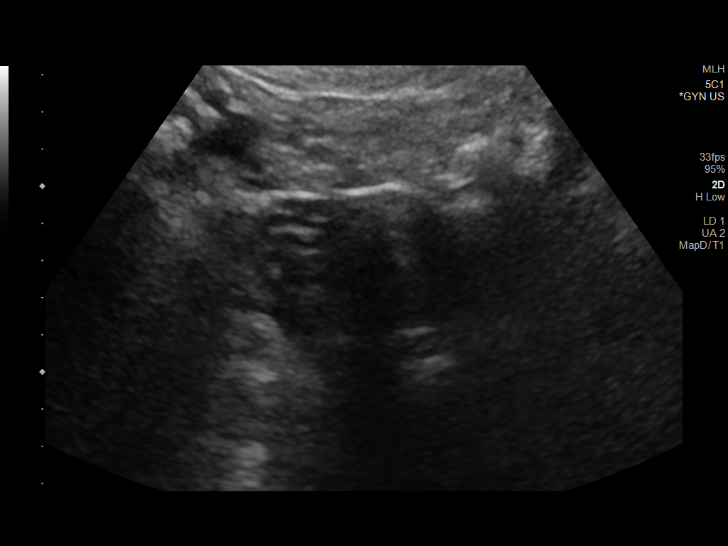
[im 21/124]
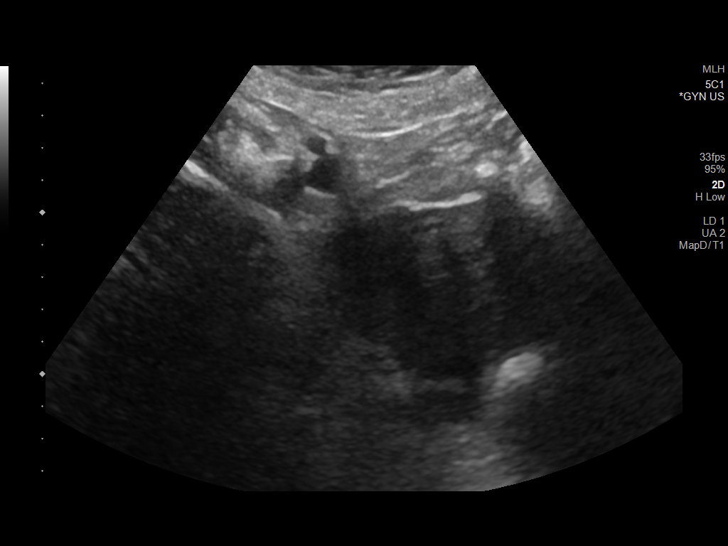
[im 31/124]
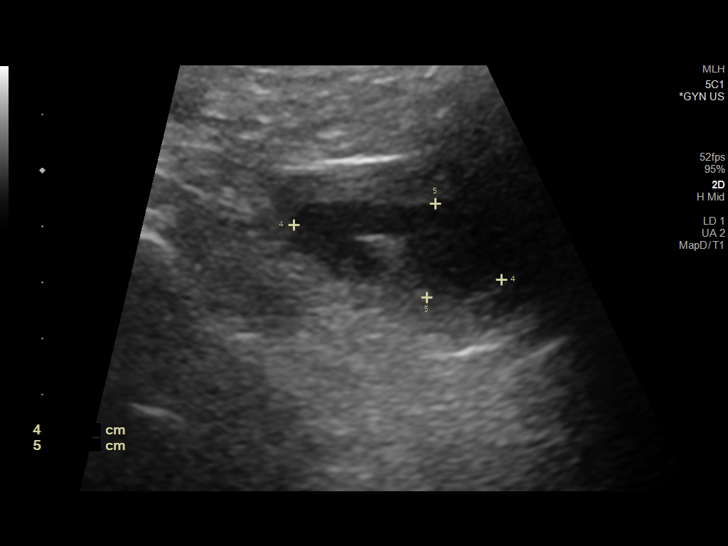
[im 42/124]
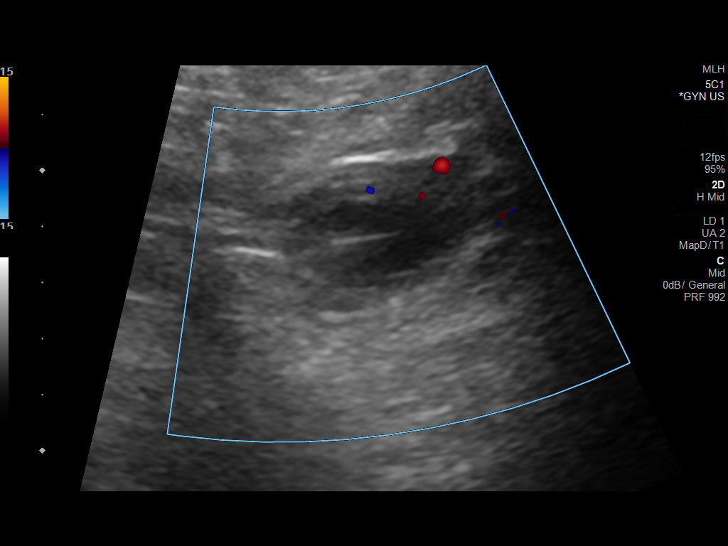
[im 52/124]
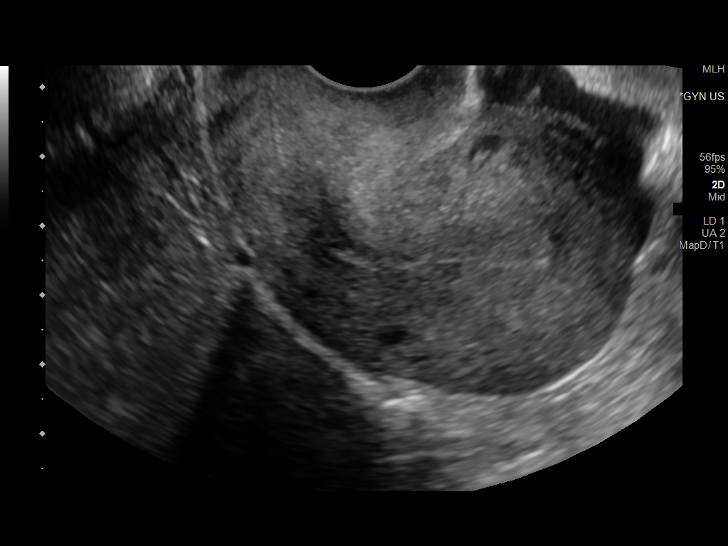
[im 62/124]
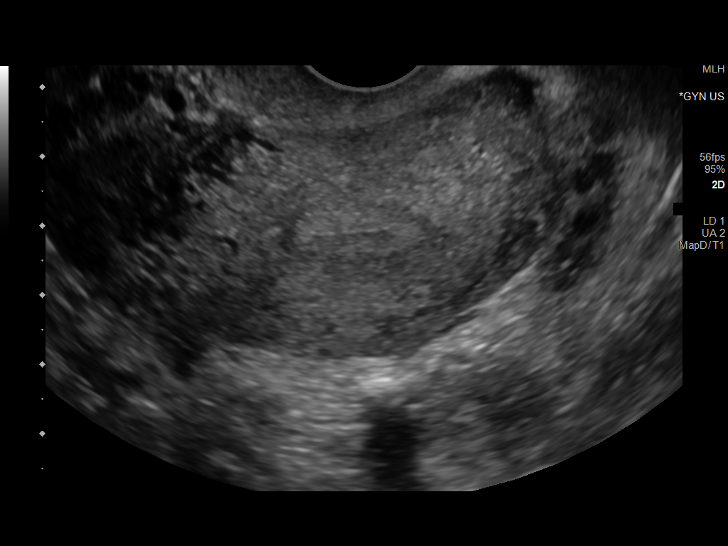
[im 72/124]
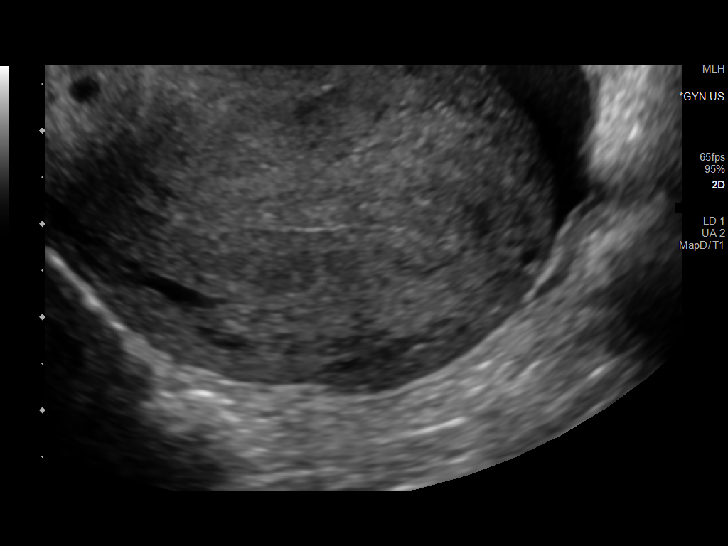
[im 83/124]
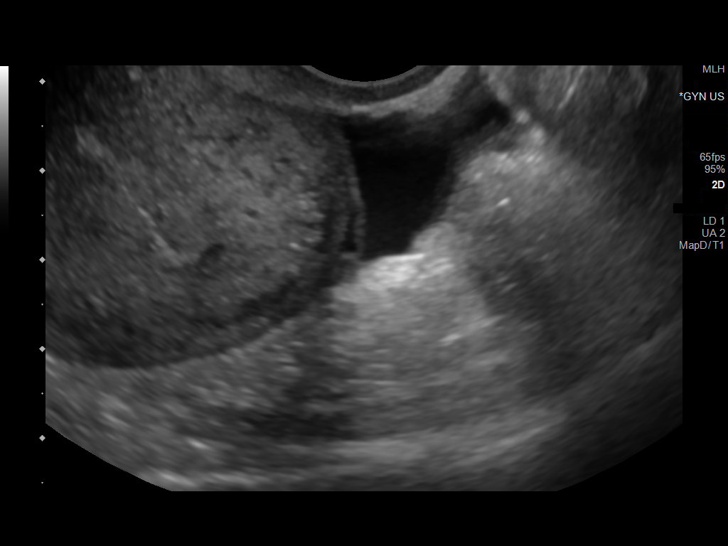
[im 93/124]
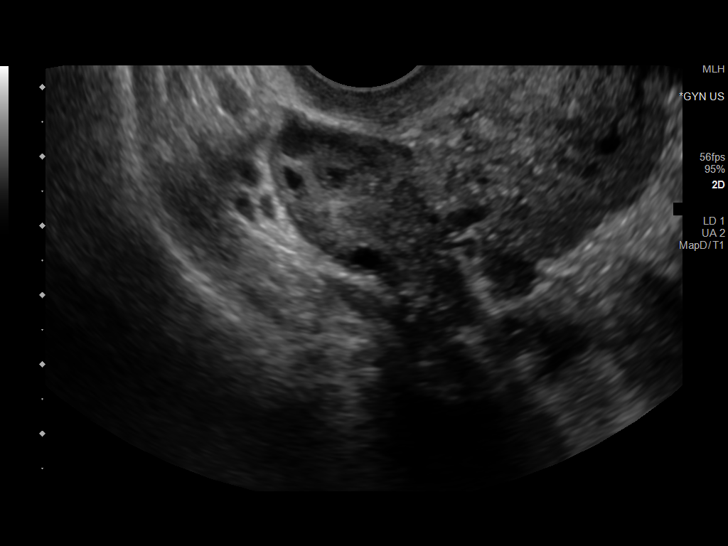
[im 103/124]
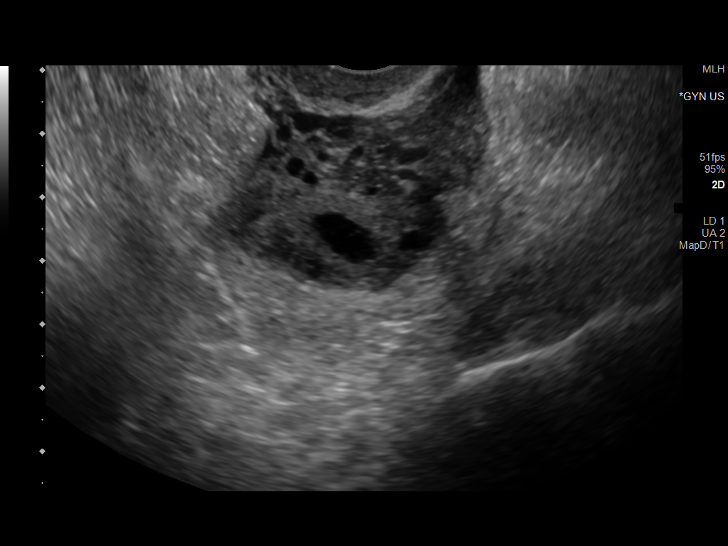
[im 113/124]
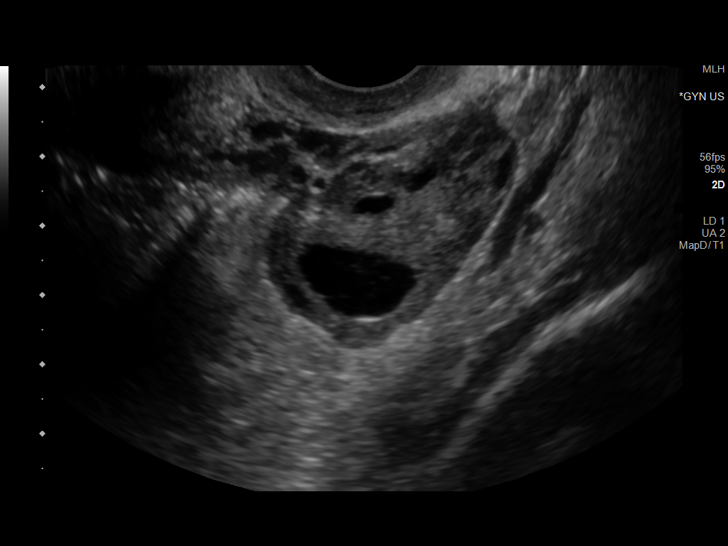
[im 124/124]
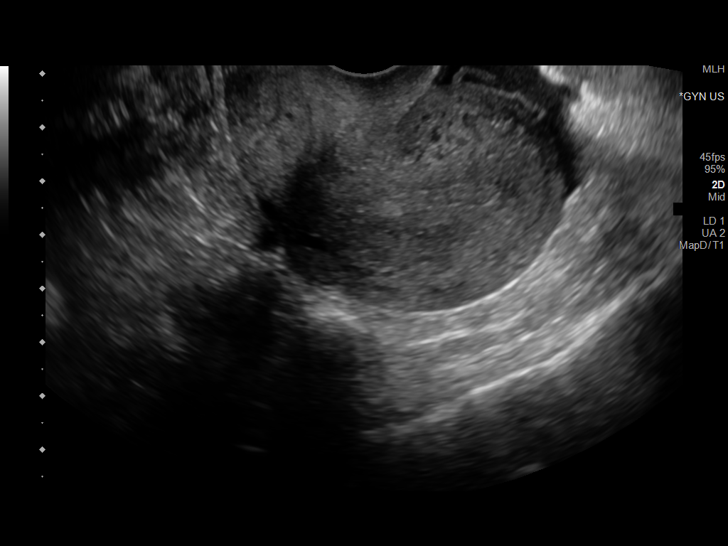

[13 of 25 positions shown; findings below may reference images not displayed]

FINDINGS: Uterus

Measurements: 6.9 x 4.2 x 5.0 cm = volume: 75 mL. No fibroids or
other mass visualized.

Endometrium

Thickness: 7 mm.  No focal abnormality visualized.

Right ovary

Measurements: 3.8 x 1.8 x 2.9 cm = volume: 10 mL. Normal
appearance/no adnexal mass.

Left ovary

Measurements: 5.1 x 2.8 x 4.1 cm = volume: 31 mL. 2.8 x 2.2 x 2.8 cm
irregular follicle with internal contents/debris suggesting
hemorrhage.

Other findings

Small volume complex free fluid noted in the cul-de-sac.
IMPRESSION: 1. 2.8 cm probable hemorrhagic cyst/follicle in left ovary. Given
that the patient is symptomatic, follow-up ultrasound in 6-12 weeks
may be warranted to ensure resolution.
2. Small volume complex fluid in the cul-de-sac. This could be
related to a component of blood products although infectious debris
could also have this appearance.

## 2021-09-24 ENCOUNTER — Telehealth (INDEPENDENT_AMBULATORY_CARE_PROVIDER_SITE_OTHER): Payer: No Payment, Other | Admitting: Psychiatry

## 2021-09-24 ENCOUNTER — Encounter (HOSPITAL_COMMUNITY): Payer: Self-pay | Admitting: Psychiatry

## 2021-09-24 DIAGNOSIS — F411 Generalized anxiety disorder: Secondary | ICD-10-CM

## 2021-09-24 DIAGNOSIS — F3181 Bipolar II disorder: Secondary | ICD-10-CM | POA: Diagnosis not present

## 2021-09-24 MED ORDER — TRAZODONE HCL 50 MG PO TABS: 50.0000 mg | ORAL_TABLET | Freq: Every evening | ORAL | 3 refills | Status: DC | PRN

## 2021-09-24 MED ORDER — ARIPIPRAZOLE 5 MG PO TABS: 5.0000 mg | ORAL_TABLET | Freq: Every day | ORAL | 3 refills | Status: DC

## 2021-09-24 MED ORDER — FLUVOXAMINE MALEATE 50 MG PO TABS: 150.0000 mg | ORAL_TABLET | Freq: Every day | ORAL | 3 refills | Status: DC

## 2021-09-24 NOTE — Progress Notes (Signed)
BH MD/PA/NP OP Progress Note Virtual Visit via Telephone Note  I connected with Crystal Marshall on 09/24/21 at  3:00 PM EDT by telephone and verified that I am speaking with the correct person using two identifiers.  Location: Patient: home Provider: Clinic   I discussed the limitations, risks, security and privacy concerns of performing an evaluation and management service by telephone and the availability of in person appointments. I also discussed with the patient that there may be a patient responsible charge related to this service. The patient expressed understanding and agreed to proceed.   I provided 30 minutes of non-face-to-face time during this encounter.      09/24/2021 1:38 PM Crystal Marshall  MRN:  [id number]  Chief Complaint: "I have been having anxiety".  HPI: 26 year old female seen today for follow-up psychiatric evaluation.  She has a history of bipolar 2 disorder, depression, and anxiety. She is currently prescribed Trazodone 50 mg nightly as needed, Abilify 5 mg daily, and Luvox 100 mg. She notes that her medications are somewhat effective in managing her psychiatric conditions.   Today she was unable to login virtually so assessment done over the phone.  During exam she was pleasant, cooperative, and engaged in conversation.  She informed Clinical research associate that since her last visit she has been having increased anxiety which causes her to feel nauseous and vomit.  She notes that when the spells occur her chest tightened, she is short of breath, has increased heart palpitations, and becomes dizzy.  Patient notes that she is worried about her health as well as her cat's health.  Provider conducted a GAD-7 and patient scored a 13.  Provider also conducted a PHQ-9 and patient scored a 12.  She endorses adequate appetite and endorses hypersomnia noting that she sleeps 12 hours nightly.  Today she denies SI/HI/AVH, mania, paranoia.    Provider offered patient hydroxyzine  however she notes that she tried it in the past and reports that she disliked it.  Today Luvox increased from 100 mg to 150 mg to help manage anxiety and depression.  Patient has also tried BuSpar and finds it ineffective.  No other medication changes made today.  Patient agreeable to continue medications as prescribed.  Provider also discussed utilizing grounding techniques to help manage anxiety/panic attacks when they occur.  She endorsed understanding and agreed.   Visit Diagnosis:    ICD-10-CM   1. Bipolar 2 disorder, major depressive episode (HCC)  F31.81 ARIPiprazole (ABILIFY) 5 MG tablet    traZODone (DESYREL) 50 MG tablet    2. Generalized anxiety disorder  F41.1 fluvoxaMINE (LUVOX) 50 MG tablet    traZODone (DESYREL) 50 MG tablet      Past Psychiatric History: Anxiety and depression   Past Medical History:  Past Medical History:  Diagnosis Date   Anxiety    Depression    History reviewed. No pertinent surgical history.  Family Psychiatric History: Unknown  Family History:  Family History  Problem Relation Age of Onset   Hypertension Mother    Diabetes Father    Hypertension Father    Hyperlipidemia Father    Diabetes Other     Social History:  Social History   Socioeconomic History   Marital status: Media planner    Spouse name: Not on file   Number of children: Not on file   Years of education: Not on file   Highest education level: Not on file  Occupational History   Not on file  Tobacco Use   Smoking status: Never   Smokeless tobacco: Never  Vaping Use   Vaping Use: Never used  Substance and Sexual Activity   Alcohol use: Yes   Drug use: Yes    Types: Marijuana   Sexual activity: Yes    Birth control/protection: Pill  Other Topics Concern   Not on file  Social History Narrative   Not on file   Social Determinants of Health   Financial Resource Strain: Not on file  Food Insecurity: Not on file  Transportation Needs: Not on file   Physical Activity: Not on file  Stress: Not on file  Social Connections: Not on file    Allergies: No Known Allergies  Metabolic Disorder Labs: No results found for: HGBA1C, MPG No results found for: PROLACTIN No results found for: CHOL, TRIG, HDL, CHOLHDL, VLDL, LDLCALC No results found for: TSH  Therapeutic Level Labs: No results found for: LITHIUM No results found for: VALPROATE No components found for:  CBMZ  Current Medications: Current Outpatient Medications  Medication Sig Dispense Refill   ARIPiprazole (ABILIFY) 5 MG tablet Take 1 tablet (5 mg total) by mouth daily. 30 tablet 3   fluvoxaMINE (LUVOX) 50 MG tablet Take 3 tablets (150 mg total) by mouth daily. 90 tablet 3   ondansetron (ZOFRAN ODT) 8 MG disintegrating tablet Take 1 tablet (8 mg total) by mouth every 8 (eight) hours as needed for nausea or vomiting. 10 tablet 0   traZODone (DESYREL) 50 MG tablet Take 1 tablet (50 mg total) by mouth at bedtime as needed for sleep. 30 tablet 3   No current facility-administered medications for this visit.     Musculoskeletal: Strength & Muscle Tone:  Unable to assess via telephone visit, Gait & Station:  Unable to assess via telephone visit Patient leans: N/A   Psychiatric Specialty Exam: Review of Systems  There were no vitals taken for this visit.There is no height or weight on file to calculate BMI.  General Appearance:   Unable to assess via telephone visit,   Eye Contact:    Unable to assess via telephone visit,  Speech:  Clear and Coherent and Normal Rate  Volume:  Normal  Mood:  Anxious and Depressed  Affect:  Congruent  Thought Process:  Coherent, Goal Directed and Linear  Orientation:  Full (Time, Place, and Person)  Thought Content: WDL and Logical   Suicidal Thoughts:  No  Homicidal Thoughts:  No  Memory:  Immediate;   Good Recent;   Good Remote;   Good  Judgement:  Good  Insight:  Good  Psychomotor Activity:    Unable to assess via telephone  visit,   Concentration:  Concentration: Good and Attention Span: Good  Recall:  Good  Fund of Knowledge: Good  Language: Good  Akathisia:    Unable to assess via telephone visit,   Handed:  Right  AIMS (if indicated): Not done  Assets:  Communication Skills Desire for Improvement Financial Resources/Insurance Housing Social Support  ADL's:  Intact  Cognition: WNL  Sleep:  Good    Assessment and Plan: Patient notes that her anxiety and depression has worsened since her last visit.  Today she is agreeable to increasing Luvox 100 mg to 150 mg to help manage anxiety and depression.  Provider offered hydroxyzine and BuSpar however patient notes that she found them ineffective in the past.  Patient will utilize grounding techniques to help manage anxiety and depression.   1. Bipolar 2 disorder, major depressive episode (  HCC)  Continue- ARIPiprazole (ABILIFY) 5 MG tablet; Take 1 tablet (5 mg total) by mouth daily.  Dispense: 30 tablet; Refill: 3 Continue- traZODone (DESYREL) 50 MG tablet; Take 1 tablet (50 mg total) by mouth at bedtime as needed for sleep.  Dispense: 30 tablet; Refill: 3  2. Generalized anxiety disorder  Increased- fluvoxaMINE (LUVOX) 50 MG tablet; Take 3 tablets (150 mg total) by mouth daily.  Dispense: 90 tablet; Refill: 3 Continue- traZODone (DESYREL) 50 MG tablet; Take 1 tablet (50 mg total) by mouth at bedtime as needed for sleep.  Dispense: 30 tablet; Refill: 3    Follow up in 3 months Follow up with therapy     Shanna CiscoBrittney E Baldo Hufnagle, NP 09/24/2021, 1:38 PM

## 2021-10-02 ENCOUNTER — Encounter (HOSPITAL_BASED_OUTPATIENT_CLINIC_OR_DEPARTMENT_OTHER): Payer: Self-pay

## 2021-10-02 ENCOUNTER — Other Ambulatory Visit: Payer: Self-pay

## 2021-10-02 ENCOUNTER — Emergency Department (HOSPITAL_BASED_OUTPATIENT_CLINIC_OR_DEPARTMENT_OTHER)
Admission: EM | Admit: 2021-10-02 | Discharge: 2021-10-02 | Discharge status: Against Medical Advice | Payer: Self-pay | Attending: Emergency Medicine | Admitting: Emergency Medicine

## 2021-10-02 DIAGNOSIS — R197 Diarrhea, unspecified: Secondary | ICD-10-CM | POA: Insufficient documentation

## 2021-10-02 DIAGNOSIS — R112 Nausea with vomiting, unspecified: Secondary | ICD-10-CM | POA: Insufficient documentation

## 2021-10-02 DIAGNOSIS — R1084 Generalized abdominal pain: Secondary | ICD-10-CM | POA: Insufficient documentation

## 2021-10-02 LAB — CBC WITH DIFFERENTIAL/PLATELET
Abs Immature Granulocytes: 0.07 10*3/uL (ref 0.00–0.07)
Basophils Absolute: 0 10*3/uL (ref 0.0–0.1)
Basophils Relative: 0 %
Eosinophils Absolute: 0 10*3/uL (ref 0.0–0.5)
Eosinophils Relative: 0 %
HCT: 39.7 % (ref 36.0–46.0)
Hemoglobin: 14.1 g/dL (ref 12.0–15.0)
Immature Granulocytes: 1 %
Lymphocytes Relative: 11 %
Lymphs Abs: 1.7 10*3/uL (ref 0.7–4.0)
MCH: 29.6 pg (ref 26.0–34.0)
MCHC: 35.5 g/dL (ref 30.0–36.0)
MCV: 83.2 fL (ref 80.0–100.0)
Monocytes Absolute: 0.2 10*3/uL (ref 0.1–1.0)
Monocytes Relative: 2 %
Neutro Abs: 13.1 10*3/uL — ABNORMAL HIGH (ref 1.7–7.7)
Neutrophils Relative %: 86 %
Platelets: 336 10*3/uL (ref 150–400)
RBC: 4.77 MIL/uL (ref 3.87–5.11)
RDW: 12.6 % (ref 11.5–15.5)
WBC: 15.1 10*3/uL — ABNORMAL HIGH (ref 4.0–10.5)
nRBC: 0 % (ref 0.0–0.2)

## 2021-10-02 LAB — COMPREHENSIVE METABOLIC PANEL
ALT: 86 U/L — ABNORMAL HIGH (ref 0–44)
AST: 39 U/L (ref 15–41)
Albumin: 4.8 g/dL (ref 3.5–5.0)
Alkaline Phosphatase: 53 U/L (ref 38–126)
Anion gap: 15 (ref 5–15)
BUN: 7 mg/dL (ref 6–20)
CO2: 15 mmol/L — ABNORMAL LOW (ref 22–32)
Calcium: 9.7 mg/dL (ref 8.9–10.3)
Chloride: 107 mmol/L (ref 98–111)
Creatinine, Ser: 0.71 mg/dL (ref 0.44–1.00)
GFR, Estimated: >60 mL/min (ref >60)
Glucose, Bld: 163 mg/dL — ABNORMAL HIGH (ref 70–99)
Potassium: 3.4 mmol/L — ABNORMAL LOW (ref 3.5–5.1)
Sodium: 137 mmol/L (ref 135–145)
Total Bilirubin: 1.1 mg/dL (ref 0.3–1.2)
Total Protein: 8.2 g/dL — ABNORMAL HIGH (ref 6.5–8.1)

## 2021-10-02 LAB — LIPASE, BLOOD: Lipase: 26 U/L (ref 11–51)

## 2021-10-02 MED ORDER — SODIUM CHLORIDE 0.9 % IV BOLUS
1000.0000 mL | Freq: Once | INTRAVENOUS | Status: AC
  Administered 2021-10-02: 1000 mL via INTRAVENOUS

## 2021-10-02 MED ORDER — PROMETHAZINE HCL 25 MG RE SUPP: 25.0000 mg | Freq: Four times a day (QID) | RECTAL | 0 refills | Status: DC | PRN

## 2021-10-02 MED ORDER — MORPHINE SULFATE (PF) 4 MG/ML IV SOLN
4.0000 mg | Freq: Once | INTRAVENOUS | Status: AC
  Administered 2021-10-02: 4 mg via INTRAVENOUS
  Filled 2021-10-02: qty 1

## 2021-10-02 MED ORDER — METOCLOPRAMIDE HCL 5 MG/ML IJ SOLN
10.0000 mg | Freq: Once | INTRAMUSCULAR | Status: AC
  Administered 2021-10-02: 10 mg via INTRAVENOUS
  Filled 2021-10-02: qty 2

## 2021-10-02 MED ORDER — ONDANSETRON 8 MG PO TBDP
8.0000 mg | ORAL_TABLET | Freq: Three times a day (TID) | ORAL | 0 refills | Status: AC | PRN
Start: 2021-10-02 — End: ?

## 2021-10-02 NOTE — ED Triage Notes (Addendum)
Patient c/o abd pain and n/v x 1.5 weeks. Patient dry heaving in triage.

## 2021-10-02 NOTE — Discharge Instructions (Addendum)
Please return if your symptoms begin to worsen or you would like a full evaluation.  At this time I do not have any of your lab results.  Call the GI doctor to get established for an appointment even if you feel better.  Continue to try and stop smoking marijuana as this may make your symptoms worse.  I sent a refill of your Zofran as well as Phenergan suppositories to your pharmacy.  Use these as prescribed, do not use them at the same time.

## 2021-10-02 NOTE — ED Notes (Signed)
Signed AMA. Not driving home, declined w/c

## 2021-10-02 NOTE — ED Provider Notes (Signed)
[facility] EMERGENCY DEPARTMENT Provider Note   CSN: [id number] Arrival date & time: 10/02/21  1412     History  Chief Complaint  Patient presents with   Abdominal Pain   Emesis    Crystal Marshall is a 26 y.o. female with a past medical history of anxiety disorder, depression, nausea and vomiting presenting today with abdominal pain, nausea vomiting and 1 episode of diarrhea that started this morning.  Has not changed her diet, no recent travel, no sick contacts or recent antibiotic use.  Says that she smokes marijuana but has been trying to stop.  Has not smoked in the last couple weeks.  No history of IBS/IBD/CRC in patient's family.  Had an appointment for this with GI last month but canceled it because she was feeling better.  Ran out of her at home Zofran.  Abdominal Pain Associated symptoms: vomiting   Emesis Associated symptoms: abdominal pain        Home Medications Prior to Admission medications   Medication Sig Start Date End Date Taking? Authorizing Provider  ARIPiprazole (ABILIFY) 5 MG tablet Take 1 tablet (5 mg total) by mouth daily. 09/24/21   Shanna Cisco, NP  fluvoxaMINE (LUVOX) 50 MG tablet Take 3 tablets (150 mg total) by mouth daily. 09/24/21   Shanna Cisco, NP  ondansetron (ZOFRAN ODT) 8 MG disintegrating tablet Take 1 tablet (8 mg total) by mouth every 8 (eight) hours as needed for nausea or vomiting. 08/25/19   Molpus, Jonny Ruiz, MD  traZODone (DESYREL) 50 MG tablet Take 1 tablet (50 mg total) by mouth at bedtime as needed for sleep. 09/24/21   Shanna Cisco, NP      Allergies    Patient has no known allergies.    Review of Systems   Review of Systems  Gastrointestinal:  Positive for abdominal pain and vomiting.    Physical Exam Updated Vital Signs BP (!) 141/106 (BP Location: Right Arm)   Pulse 93   Temp 97.8 F (36.6 C)   Resp 20   Ht 5\' 5"  (1.651 m)   Wt 97.5 kg   LMP 09/25/2021   SpO2 98%   BMI 35.78 kg/m   Physical Exam Vitals and nursing note reviewed.  Constitutional:      Appearance: Normal appearance. She is ill-appearing.     Comments: Patient screaming, retching and rolling on the stretcher  HENT:     Head: Normocephalic and atraumatic.  Eyes:     General: No scleral icterus.    Conjunctiva/sclera: Conjunctivae normal.  Pulmonary:     Effort: Pulmonary effort is normal. No respiratory distress.  Abdominal:     General: Abdomen is flat.     Palpations: Abdomen is soft.     Tenderness: There is generalized abdominal tenderness.  Skin:    Findings: No rash.  Neurological:     Mental Status: She is alert.  Psychiatric:        Mood and Affect: Mood normal.     ED Results / Procedures / Treatments   Labs (all labs ordered are listed, but only abnormal results are displayed) Labs Reviewed  COMPREHENSIVE METABOLIC PANEL  LIPASE, BLOOD  CBC WITH DIFFERENTIAL/PLATELET  URINALYSIS, ROUTINE W REFLEX MICROSCOPIC  PREGNANCY, URINE    EKG None  Radiology No results found.  Procedures Procedures   Medications Ordered in ED Medications  sodium chloride 0.9 % bolus 1,000 mL (1,000 mLs Intravenous New Bag/Given 10/02/21 1454)  metoCLOPramide (REGLAN) injection 10 mg (10  mg Intravenous Given 10/02/21 1453)  morphine (PF) 4 MG/ML injection 4 mg (4 mg Intravenous Given 10/02/21 1455)    ED Course/ Medical Decision Making/ A&P                           Medical Decision Making Amount and/or Complexity of Data Reviewed Labs: ordered.  Risk Prescription drug management.   This patient presents to the ED for concern of abdominal pain and nausea. The differential diagnosis for generalized abdominal pain includes, but is not limited to AAA, gastroenteritis, appendicitis, Bowel obstruction, Bowel perforation. Gastroparesis, DKA, Hernia, Inflammatory bowel disease, mesenteric ischemia, pancreatitis, peritonitis SBP, volvulus.   This is not an exhaustive differential.    Past  Medical History / Co-morbidities / Social History: Recurrent nausea and vomiting, marijuana use   Additional history: Additional history obtained from chart review.  Patient was seen at [facility] a few weeks back for the same thing.   Physical Exam: Physical exam performed. The pertinent findings include: Generalized abdominal tenderness,  Lab Tests: Routine abdominal pain Labs ordered   Imaging Studies: Generalized abdominal pain, CT not ordered   Medications: I ordered medication including IVF, morphine and reglan. Reevaluation of the patient after these medicines showed that the patient stayed the same. I have reviewed the patients home medicines and have made adjustments as needed.     Disposition: Nursing staff notified me while I was in a different patient room the patient needed a different antiemetic.  Phenergan was ordered at that time.  Shortly after she notified other medical staff that she wanted to leave.  None of her labs have resulted.  I went and spoke with her and she says she would prefer to be sick at home.  I told her she would have to sign out AMA because I do not have any results.  She said that that was okay.  I am willing to refill her Zofran and send Phenergan suppositories to her pharmacy.  She will follow-up with GI and see her results online.   I discussed this case with my attending physician Dr. Renaye Rakers who cosigned this note including patient's presenting symptoms, physical exam, and planned diagnostics and interventions. Attending physician stated agreement with plan or made changes to plan which were implemented.     Final Clinical Impression(s) / ED Diagnoses Final diagnoses:  None    Rx / DC Orders ED Discharge Orders          Ordered    ondansetron (ZOFRAN ODT) 8 MG disintegrating tablet  Every 8 hours PRN        10/02/21 1526    promethazine (PHENERGAN) 25 MG suppository  Every 6 hours PRN        10/02/21 1526           Results and diagnoses  were explained to the patient. Return precautions discussed in full. Patient had no additional questions and expressed complete understanding.   This chart was dictated using voice recognition software.  Despite best efforts to proofread,  errors can occur which can change the documentation meaning.     Woodroe Chen 10/02/21 1550    Terald Sleeper, MD 10/02/21 1744

## 2021-10-02 NOTE — ED Notes (Signed)
Pt reports abd pain with n/v x 1.5 weeks Dry heaving continuously, small amt of vomitus noted in bag green in color

## 2021-10-05 ENCOUNTER — Encounter (HOSPITAL_BASED_OUTPATIENT_CLINIC_OR_DEPARTMENT_OTHER): Payer: Self-pay | Admitting: Emergency Medicine

## 2021-10-05 ENCOUNTER — Other Ambulatory Visit: Payer: Self-pay

## 2021-10-05 ENCOUNTER — Emergency Department (HOSPITAL_BASED_OUTPATIENT_CLINIC_OR_DEPARTMENT_OTHER)
Admission: EM | Admit: 2021-10-05 | Discharge: 2021-10-05 | Disposition: A | Discharge status: Home / Self Care | Payer: Self-pay | Attending: Emergency Medicine | Admitting: Emergency Medicine

## 2021-10-05 DIAGNOSIS — D72829 Elevated white blood cell count, unspecified: Secondary | ICD-10-CM | POA: Insufficient documentation

## 2021-10-05 DIAGNOSIS — R1110 Vomiting, unspecified: Secondary | ICD-10-CM

## 2021-10-05 DIAGNOSIS — R197 Diarrhea, unspecified: Secondary | ICD-10-CM | POA: Insufficient documentation

## 2021-10-05 LAB — CBC WITH DIFFERENTIAL/PLATELET
Abs Immature Granulocytes: 0.05 10*3/uL (ref 0.00–0.07)
Basophils Absolute: 0 10*3/uL (ref 0.0–0.1)
Basophils Relative: 0 %
Eosinophils Absolute: 0 10*3/uL (ref 0.0–0.5)
Eosinophils Relative: 0 %
HCT: 41.2 % (ref 36.0–46.0)
Hemoglobin: 14.4 g/dL (ref 12.0–15.0)
Immature Granulocytes: 0 %
Lymphocytes Relative: 13 %
Lymphs Abs: 1.9 10*3/uL (ref 0.7–4.0)
MCH: 29.6 pg (ref 26.0–34.0)
MCHC: 35 g/dL (ref 30.0–36.0)
MCV: 84.8 fL (ref 80.0–100.0)
Monocytes Absolute: 0.4 10*3/uL (ref 0.1–1.0)
Monocytes Relative: 3 %
Neutro Abs: 11.9 10*3/uL — ABNORMAL HIGH (ref 1.7–7.7)
Neutrophils Relative %: 84 %
Platelets: 376 10*3/uL (ref 150–400)
RBC: 4.86 MIL/uL (ref 3.87–5.11)
RDW: 12.8 % (ref 11.5–15.5)
WBC: 14.3 10*3/uL — ABNORMAL HIGH (ref 4.0–10.5)
nRBC: 0 % (ref 0.0–0.2)

## 2021-10-05 LAB — COMPREHENSIVE METABOLIC PANEL
ALT: 67 U/L — ABNORMAL HIGH (ref 0–44)
AST: 32 U/L (ref 15–41)
Albumin: 4.7 g/dL (ref 3.5–5.0)
Alkaline Phosphatase: 54 U/L (ref 38–126)
Anion gap: 9 (ref 5–15)
BUN: 6 mg/dL (ref 6–20)
CO2: 25 mmol/L (ref 22–32)
Calcium: 9.4 mg/dL (ref 8.9–10.3)
Chloride: 104 mmol/L (ref 98–111)
Creatinine, Ser: 0.74 mg/dL (ref 0.44–1.00)
GFR, Estimated: >60 mL/min (ref >60)
Glucose, Bld: 111 mg/dL — ABNORMAL HIGH (ref 70–99)
Potassium: 3.4 mmol/L — ABNORMAL LOW (ref 3.5–5.1)
Sodium: 138 mmol/L (ref 135–145)
Total Bilirubin: 0.9 mg/dL (ref 0.3–1.2)
Total Protein: 8.1 g/dL (ref 6.5–8.1)

## 2021-10-05 LAB — URINALYSIS, ROUTINE W REFLEX MICROSCOPIC
Bilirubin Urine: NEGATIVE
Glucose, UA: NEGATIVE mg/dL
Hgb urine dipstick: NEGATIVE
Ketones, ur: >=80 mg/dL — AB
Leukocytes,Ua: NEGATIVE
Nitrite: NEGATIVE
Protein, ur: NEGATIVE mg/dL
Specific Gravity, Urine: 1.025 (ref 1.005–1.030)
pH: 7 (ref 5.0–8.0)

## 2021-10-05 LAB — PREGNANCY, URINE: Preg Test, Ur: NEGATIVE

## 2021-10-05 MED ORDER — PROMETHAZINE HCL 25 MG RE SUPP: 25.0000 mg | Freq: Four times a day (QID) | RECTAL | 0 refills | Status: AC | PRN

## 2021-10-05 MED ORDER — ACETAMINOPHEN 325 MG PO TABS
650.0000 mg | ORAL_TABLET | Freq: Once | ORAL | Status: AC
Start: 2021-10-05 — End: 2021-10-05
  Administered 2021-10-05: 650 mg via ORAL
  Filled 2021-10-05: qty 2

## 2021-10-05 NOTE — ED Triage Notes (Signed)
Pt states she has had "issues with vomiting" for about 3 months. She states it seems to be becoming more frequent, and she was concerned tonight when she checked her lab results on mychart and "they were bad". Pt states she used to socially smoke marijuana but states she quit about 1 month ago. Denies abd pain. Reports pain "only in my chest if I can't control my breathing" but denies pain now. Pt states she has "three different doctors for this".

## 2021-10-05 NOTE — ED Provider Notes (Signed)
[facility] Provider Note: Crystal Marshall Kendall Arnell, MD, FACEP  CSN: [id number] MRN: [id number] ARRIVAL: 10/05/21 at 0241 ROOM: MH10/MH10   CHIEF COMPLAINT  Vomiting   HISTORY OF PRESENT ILLNESS  10/05/21 3:36 AM Crystal Marshall is a 26 y.o. female who has had episodes of recurrent vomiting for about 3 months.  It is becoming more frequent.  She was seen on 10/02/2021 for nausea, vomiting and diarrhea.  She looked on MyChart and saw that she had an elevated leukocyte count (15.1) as well as some mild abnormalities on her CMET.  These abnormalities concerned her.  She states she quit smoking marijuana about 1 month ago.    She is here with nausea, vomiting and diarrhea throughout the day yesterday.  She estimates she vomited about 9 times.  She took 2 Phenergan tablets prior to arrival with relief of her nausea.  She is not having any abdominal pain or chest pain currently.  She was noted to be febrile on arrival with a temperature of 100.6.  She denies dysuria.   Past Medical History:  Diagnosis Date   Anxiety    Depression     Past Surgical History:  Procedure Laterality Date   TUMOR EXCISION Right    benign tumor removal    Family History  Problem Relation Age of Onset   Hypertension Mother    Diabetes Father    Hypertension Father    Hyperlipidemia Father    Diabetes Other     Social History   Tobacco Use   Smoking status: Never   Smokeless tobacco: Never  Vaping Use   Vaping Use: Never used  Substance Use Topics   Alcohol use: Yes   Drug use: Yes    Types: Marijuana    Prior to Admission medications   Medication Sig Start Date End Date Taking? Authorizing Provider  ARIPiprazole (ABILIFY) 5 MG tablet Take 1 tablet (5 mg total) by mouth daily. 09/24/21   Shanna CiscoParsons, Brittney E, NP  fluvoxaMINE (LUVOX) 50 MG tablet Take 3 tablets (150 mg total) by mouth daily. 09/24/21   Shanna CiscoParsons, Brittney E, NP  ondansetron (ZOFRAN ODT) 8 MG disintegrating tablet Take 1  tablet (8 mg total) by mouth every 8 (eight) hours as needed for nausea or vomiting. 10/02/21   Redwine, Madison A, PA-C  promethazine (PHENERGAN) 25 MG suppository Place 1 suppository (25 mg total) rectally every 6 (six) hours as needed for nausea or vomiting. 10/05/21   Rashad Auld, MD  traZODone (DESYREL) 50 MG tablet Take 1 tablet (50 mg total) by mouth at bedtime as needed for sleep. 09/24/21   Shanna CiscoParsons, Brittney E, NP    Allergies Patient has no known allergies.   REVIEW OF SYSTEMS  Negative except as noted here or in the History of Present Illness.   PHYSICAL EXAMINATION  Initial Vital Signs Blood pressure 112/73, pulse 95, temperature (!) 100.6 F (38.1 C), temperature source Oral, resp. rate 17, last menstrual period 09/25/2021, SpO2 96 %.  Examination General: Well-developed, well-nourished female in no acute distress; appearance consistent with age of record HENT: normocephalic; atraumatic Eyes: Normal appearance Neck: supple Heart: regular rate and rhythm Lungs: clear to auscultation bilaterally Abdomen: soft; nondistended; nontender; bowel sounds present Extremities: No deformity; full range of motion; pulses normal Neurologic: Awake, alert and oriented; motor function intact in all extremities and symmetric; no facial droop Skin: Warm and dry; facial hirsutism Psychiatric: Normal mood and affect   RESULTS  Summary of this visit's results, reviewed and  interpreted by myself:   EKG Interpretation  Date/Time:    Ventricular Rate:    PR Interval:    QRS Duration:   QT Interval:    QTC Calculation:   R Axis:     Text Interpretation:         Laboratory Studies: Results for orders placed or performed during the hospital encounter of 10/05/21 (from the past 24 hour(s))  Urinalysis, Routine w reflex microscopic Urine, Clean Catch     Status: Abnormal   Collection Time: 10/05/21  3:39 AM  Result Value Ref Range   Color, Urine YELLOW YELLOW   APPearance CLEAR  CLEAR   Specific Gravity, Urine 1.025 1.005 - 1.030   pH 7.0 5.0 - 8.0   Glucose, UA NEGATIVE NEGATIVE mg/dL   Hgb urine dipstick NEGATIVE NEGATIVE   Bilirubin Urine NEGATIVE NEGATIVE   Ketones, ur >=80 (A) NEGATIVE mg/dL   Protein, ur NEGATIVE NEGATIVE mg/dL   Nitrite NEGATIVE NEGATIVE   Leukocytes,Ua NEGATIVE NEGATIVE  Pregnancy, urine     Status: None   Collection Time: 10/05/21  3:39 AM  Result Value Ref Range   Preg Test, Ur NEGATIVE NEGATIVE  CBC with Differential     Status: Abnormal   Collection Time: 10/05/21  3:39 AM  Result Value Ref Range   WBC 14.3 (H) 4.0 - 10.5 K/uL   RBC 4.86 3.87 - 5.11 MIL/uL   Hemoglobin 14.4 12.0 - 15.0 g/dL   HCT 53.2 99.2 - 42.6 %   MCV 84.8 80.0 - 100.0 fL   MCH 29.6 26.0 - 34.0 pg   MCHC 35.0 30.0 - 36.0 g/dL   RDW 83.4 19.6 - 22.2 %   Platelets 376 150 - 400 K/uL   nRBC 0.0 0.0 - 0.2 %   Neutrophils Relative % 84 %   Neutro Abs 11.9 (H) 1.7 - 7.7 K/uL   Lymphocytes Relative 13 %   Lymphs Abs 1.9 0.7 - 4.0 K/uL   Monocytes Relative 3 %   Monocytes Absolute 0.4 0.1 - 1.0 K/uL   Eosinophils Relative 0 %   Eosinophils Absolute 0.0 0.0 - 0.5 K/uL   Basophils Relative 0 %   Basophils Absolute 0.0 0.0 - 0.1 K/uL   Immature Granulocytes 0 %   Abs Immature Granulocytes 0.05 0.00 - 0.07 K/uL  Comprehensive metabolic panel     Status: Abnormal   Collection Time: 10/05/21  3:39 AM  Result Value Ref Range   Sodium 138 135 - 145 mmol/L   Potassium 3.4 (L) 3.5 - 5.1 mmol/L   Chloride 104 98 - 111 mmol/L   CO2 25 22 - 32 mmol/L   Glucose, Bld 111 (H) 70 - 99 mg/dL   BUN 6 6 - 20 mg/dL   Creatinine, Ser 9.79 0.44 - 1.00 mg/dL   Calcium 9.4 8.9 - 89.2 mg/dL   Total Protein 8.1 6.5 - 8.1 g/dL   Albumin 4.7 3.5 - 5.0 g/dL   AST 32 15 - 41 U/L   ALT 67 (H) 0 - 44 U/L   Alkaline Phosphatase 54 38 - 126 U/L   Total Bilirubin 0.9 0.3 - 1.2 mg/dL   GFR, Estimated >11 >94 mL/min   Anion gap 9 5 - 15   Imaging Studies: No results found.  ED  COURSE and MDM  Nursing notes, initial and subsequent vitals signs, including pulse oximetry, reviewed and interpreted by myself.  Vitals:   10/05/21 0313  BP: 112/73  Pulse: 95  Resp: 17  Temp: (!) 100.6 F (38.1 C)  TempSrc: Oral  SpO2: 96%   Medications  acetaminophen (TYLENOL) tablet 650 mg (650 mg Oral Given 10/05/21 0339)   4:21 AM Patient feeling better after acetaminophen.  The cause of her fever is unclear but could be due to a viral illness or to the intra-abdominal process that is causing her symptoms.  She is not having abdominal pain however so I do not believe a CT scan is indicated at this time.  She is requesting a refill of her Phenergan, preferably suppositories.  She is also requesting a referral to gastroenterology.   PROCEDURES  Procedures   ED DIAGNOSES     ICD-10-CM   1. Recurrent vomiting  R11.10          Brentt Fread, Jonny Ruiz, MD 10/05/21 0425

## 2021-12-24 ENCOUNTER — Encounter (HOSPITAL_COMMUNITY): Payer: Self-pay | Admitting: Psychiatry

## 2021-12-24 ENCOUNTER — Telehealth (INDEPENDENT_AMBULATORY_CARE_PROVIDER_SITE_OTHER): Payer: No Payment, Other | Admitting: Psychiatry

## 2021-12-24 DIAGNOSIS — F411 Generalized anxiety disorder: Secondary | ICD-10-CM

## 2021-12-24 DIAGNOSIS — F3181 Bipolar II disorder: Secondary | ICD-10-CM | POA: Diagnosis not present

## 2021-12-24 MED ORDER — ARIPIPRAZOLE 5 MG PO TABS: 5.0000 mg | ORAL_TABLET | Freq: Every day | ORAL | 3 refills | Status: DC

## 2021-12-24 MED ORDER — FLUVOXAMINE MALEATE 50 MG PO TABS: 150.0000 mg | ORAL_TABLET | Freq: Every day | ORAL | 3 refills | Status: DC

## 2021-12-24 MED ORDER — TRAZODONE HCL 50 MG PO TABS: 50.0000 mg | ORAL_TABLET | Freq: Every evening | ORAL | 3 refills | Status: DC | PRN

## 2021-12-24 NOTE — Progress Notes (Signed)
[facility] MD/PA/NP OP Progress Note Virtual Visit via Video Note  I connected with Crystal Marshall on 12/24/21 at  1:00 PM EDT by a video enabled telemedicine application and verified that I am speaking with the correct person using two identifiers.  Location: Patient: Home Provider: Clinic   I discussed the limitations of evaluation and management by telemedicine and the availability of in person appointments. The patient expressed understanding and agreed to proceed.  I provided 30 minutes of non-face-to-face time during this encounter.        12/24/2021 1:31 PM Crystal Marshall  MRN:  [id number]  Chief Complaint: "I have been having anxiety".  HPI: 26 year old female seen today for follow-up psychiatric evaluation.  She has a history of bipolar 2 disorder, depression, and anxiety. She is currently prescribed Trazodone 50 mg nightly as needed, Abilify 5 mg daily, and Luvox 150 mg. She notes that her medications are effective in managing her psychiatric conditions.   Today she was well groomed, pleasant, cooperative, and engaged in conversation.  She informed Clinical research associate that since her last visit she she feels less anxious. She notes that she no longer vomits when she feels nervous. She does report feeling anxious about her parents. She notes her father has issues with substance use. Today provider conducted a GAD and patient scored a 15. Provider also conducted a PHQ 9 patient scored a 10. She endorses adequate sleep and increased appetite. She notes that she has lost 6 pounds in the last month. Today she denies SI/HI/VAH, mania or paranoia.   No medication changes made today. Patient agreeable to continue medications as prescribed. No other concerns noted at this time. Visit Diagnosis:    ICD-10-CM   1. Bipolar 2 disorder, major depressive episode (HCC)  F31.81 traZODone (DESYREL) 50 MG tablet    ARIPiprazole (ABILIFY) 5 MG tablet    2. Generalized anxiety disorder  F41.1  traZODone (DESYREL) 50 MG tablet    fluvoxaMINE (LUVOX) 50 MG tablet      Past Psychiatric History: Anxiety and depression   Past Medical History:  Past Medical History:  Diagnosis Date   Anxiety    Depression     Past Surgical History:  Procedure Laterality Date   TUMOR EXCISION Right    benign tumor removal    Family Psychiatric History: Unknown  Family History:  Family History  Problem Relation Age of Onset   Hypertension Mother    Diabetes Father    Hypertension Father    Hyperlipidemia Father    Diabetes Other     Social History:  Social History   Socioeconomic History   Marital status: Media planner    Spouse name: Not on file   Number of children: Not on file   Years of education: Not on file   Highest education level: Not on file  Occupational History   Not on file  Tobacco Use   Smoking status: Never   Smokeless tobacco: Never  Vaping Use   Vaping Use: Never used  Substance and Sexual Activity   Alcohol use: Yes   Drug use: Yes    Types: Marijuana   Sexual activity: Yes    Birth control/protection: Pill  Other Topics Concern   Not on file  Social History Narrative   Not on file   Social Determinants of Health   Financial Resource Strain: Not on file  Food Insecurity: Not on file  Transportation Needs: Not on file  Physical Activity: Not on file  Stress: Not on file  Social Connections: Not on file    Allergies: No Known Allergies  Metabolic Disorder Labs: No results found for: "HGBA1C", "MPG" No results found for: "PROLACTIN" No results found for: "CHOL", "TRIG", "HDL", "CHOLHDL", "VLDL", "LDLCALC" No results found for: "TSH"  Therapeutic Level Labs: No results found for: "LITHIUM" No results found for: "VALPROATE" No results found for: "CBMZ"  Current Medications: Current Outpatient Medications  Medication Sig Dispense Refill   ARIPiprazole (ABILIFY) 5 MG tablet Take 1 tablet (5 mg total) by mouth daily. 30 tablet 3    fluvoxaMINE (LUVOX) 50 MG tablet Take 3 tablets (150 mg total) by mouth daily. 90 tablet 3   ondansetron (ZOFRAN ODT) 8 MG disintegrating tablet Take 1 tablet (8 mg total) by mouth every 8 (eight) hours as needed for nausea or vomiting. 21 tablet 0   promethazine (PHENERGAN) 25 MG suppository Place 1 suppository (25 mg total) rectally every 6 (six) hours as needed for nausea or vomiting. 24 each 0   traZODone (DESYREL) 50 MG tablet Take 1 tablet (50 mg total) by mouth at bedtime as needed for sleep. 30 tablet 3   No current facility-administered medications for this visit.     Musculoskeletal: Strength & Muscle Tone: within normal limits and telehealth visit Gait & Station: normal, telehealth visit Patient leans: N/A   Psychiatric Specialty Exam: Review of Systems  There were no vitals taken for this visit.There is no height or weight on file to calculate BMI.  General Appearance: Well Groomed  Eye Contact:  Good  Speech:  Clear and Coherent and Normal Rate  Volume:  Normal  Mood:  Anxious and Depressed  Affect:  Congruent  Thought Process:  Coherent, Goal Directed and Linear  Orientation:  Full (Time, Place, and Person)  Thought Content: WDL and Logical   Suicidal Thoughts:  No  Homicidal Thoughts:  No  Memory:  Immediate;   Good Recent;   Good Remote;   Good  Judgement:  Good  Insight:  Good  Psychomotor Activity:  Normal  Concentration:  Concentration: Good and Attention Span: Good  Recall:  Good  Fund of Knowledge: Good  Language: Good  Akathisia:  No  Handed:  Right  AIMS (if indicated): Not done  Assets:  Communication Skills Desire for Improvement Financial Resources/Insurance Housing Social Support  ADL's:  Intact  Cognition: WNL  Sleep:  Good    Assessment and Plan: Patient informed Clinical research associate that her anxiety and depression has improved since her last visit.  At this time medications not adjusted.  Patient agreeable to continue medication as prescribed.  1.  Bipolar 2 disorder, major depressive episode (HCC)  Continue- ARIPiprazole (ABILIFY) 5 MG tablet; Take 1 tablet (5 mg total) by mouth daily.  Dispense: 30 tablet; Refill: 3 Continue- traZODone (DESYREL) 50 MG tablet; Take 1 tablet (50 mg total) by mouth at bedtime as needed for sleep.  Dispense: 30 tablet; Refill: 3  2. Generalized anxiety disorder  Continue- fluvoxaMINE (LUVOX) 50 MG tablet; Take 3 tablets (150 mg total) by mouth daily.  Dispense: 90 tablet; Refill: 3 Continue- traZODone (DESYREL) 50 MG tablet; Take 1 tablet (50 mg total) by mouth at bedtime as needed for sleep.  Dispense: 30 tablet; Refill: 3    Follow up in 3 months Follow up with therapy     Shanna Cisco, NP 12/24/2021, 1:31 PM

## 2022-03-26 ENCOUNTER — Telehealth (INDEPENDENT_AMBULATORY_CARE_PROVIDER_SITE_OTHER): Payer: No Payment, Other | Admitting: Psychiatry

## 2022-03-26 ENCOUNTER — Encounter (HOSPITAL_COMMUNITY): Payer: Self-pay | Admitting: Psychiatry

## 2022-03-26 DIAGNOSIS — F411 Generalized anxiety disorder: Secondary | ICD-10-CM | POA: Diagnosis not present

## 2022-03-26 DIAGNOSIS — F3181 Bipolar II disorder: Secondary | ICD-10-CM | POA: Diagnosis not present

## 2022-03-26 MED ORDER — TRAZODONE HCL 50 MG PO TABS: 50.0000 mg | ORAL_TABLET | Freq: Every evening | ORAL | 3 refills | Status: DC | PRN

## 2022-03-26 NOTE — Progress Notes (Signed)
[place] MD/PA/NP OP Progress Note Virtual Visit via Video Note  I connected with Crystal Marshall on 03/26/22 at  1:30 PM EST by a video enabled telemedicine application and verified that I am speaking with the correct person using two identifiers.  Location: Patient: Home Provider: Clinic   I discussed the limitations of evaluation and management by telemedicine and the availability of in person appointments. The patient expressed understanding and agreed to proceed.  I provided 30 minutes of non-face-to-face time during this encounter.        03/26/2022 1:42 PM Crystal Marshall  MRN:  [id number]  Chief Complaint: "I am feeling really good".  HPI: 26 year old female seen today for follow-up psychiatric evaluation.  She has a history of bipolar 2 disorder, depression, and anxiety. She is currently prescribed Trazodone 50 mg nightly as needed, Abilify 5 mg daily, and Luvox 150 mg. She notes that  she discontinued trazodone and Luvox and feels mentally stable.   Today she was well groomed, pleasant, cooperative, and engaged in conversation.  She informed Probation officer that since discontinuing Abilify and Luvoz her mood, anxiety, and depression are well managed. At times she reports that she continues to worry about her father who suffers from substance use put notes that she is able to cope with it. . Today provider conducted a GAD and patient scored a 4, at her last  visit she scored a 70. Provider also conducted a PHQ 9 patient scored a 3, at her last visit she scored a 10. She endorses adequate sleep and appetite. Today she denies SI/HI/VAH, mania or paranoia.   At this time she does not wish to restart Abilify or Luvox. She will continue Trazodone as prescribed. No other concerns noted at this time. Visit Diagnosis:    ICD-10-CM   1. Generalized anxiety disorder  F41.1 traZODone (DESYREL) 50 MG tablet    2. Bipolar 2 disorder, major depressive episode (HCC)  F31.81 traZODone  (DESYREL) 50 MG tablet      Past Psychiatric History: Anxiety and depression   Past Medical History:  Past Medical History:  Diagnosis Date   Anxiety    Depression     Past Surgical History:  Procedure Laterality Date   TUMOR EXCISION Right    benign tumor removal    Family Psychiatric History: Unknown  Family History:  Family History  Problem Relation Age of Onset   Hypertension Mother    Diabetes Father    Hypertension Father    Hyperlipidemia Father    Diabetes Other     Social History:  Social History   Socioeconomic History   Marital status: Soil scientist    Spouse name: Not on file   Number of children: Not on file   Years of education: Not on file   Highest education level: Not on file  Occupational History   Not on file  Tobacco Use   Smoking status: Never   Smokeless tobacco: Never  Vaping Use   Vaping Use: Never used  Substance and Sexual Activity   Alcohol use: Yes   Drug use: Yes    Types: Marijuana   Sexual activity: Yes    Birth control/protection: Pill  Other Topics Concern   Not on file  Social History Narrative   Not on file   Social Determinants of Health   Financial Resource Strain: Not on file  Food Insecurity: Not on file  Transportation Needs: Not on file  Physical Activity: Not on file  Stress:  Not on file  Social Connections: Not on file    Allergies: No Known Allergies  Metabolic Disorder Labs: No results found for: "HGBA1C", "MPG" No results found for: "PROLACTIN" No results found for: "CHOL", "TRIG", "HDL", "CHOLHDL", "VLDL", "LDLCALC" No results found for: "TSH"  Therapeutic Level Labs: No results found for: "LITHIUM" No results found for: "VALPROATE" No results found for: "CBMZ"  Current Medications: Current Outpatient Medications  Medication Sig Dispense Refill   ondansetron (ZOFRAN ODT) 8 MG disintegrating tablet Take 1 tablet (8 mg total) by mouth every 8 (eight) hours as needed for nausea or  vomiting. 21 tablet 0   promethazine (PHENERGAN) 25 MG suppository Place 1 suppository (25 mg total) rectally every 6 (six) hours as needed for nausea or vomiting. 24 each 0   traZODone (DESYREL) 50 MG tablet Take 1 tablet (50 mg total) by mouth at bedtime as needed for sleep. 30 tablet 3   No current facility-administered medications for this visit.     Musculoskeletal: Strength & Muscle Tone: within normal limits and telehealth visit Gait & Station: normal, telehealth visit Patient leans: N/A   Psychiatric Specialty Exam: Review of Systems  There were no vitals taken for this visit.There is no height or weight on file to calculate BMI.  General Appearance: Well Groomed  Eye Contact:  Good  Speech:  Clear and Coherent and Normal Rate  Volume:  Normal  Mood:  Euthymic  Affect:  Congruent  Thought Process:  Coherent, Goal Directed and Linear  Orientation:  Full (Time, Place, and Person)  Thought Content: WDL and Logical   Suicidal Thoughts:  No  Homicidal Thoughts:  No  Memory:  Immediate;   Good Recent;   Good Remote;   Good  Judgement:  Good  Insight:  Good  Psychomotor Activity:  Normal  Concentration:  Concentration: Good and Attention Span: Good  Recall:  Good  Fund of Knowledge: Good  Language: Good  Akathisia:  No  Handed:  Right  AIMS (if indicated): Not done  Assets:  Communication Skills Desire for Improvement Financial Resources/Insurance Housing Social Support  ADL's:  Intact  Cognition: WNL  Sleep:  Good    Assessment and Plan: Patient notes that she discontinued Abilify and Luvox and feels mentally stable. At this time she does not wish to restart Abilify or Luvox. She will continue Trazodone as prescribed.   1. Generalized anxiety disorder  Continue- traZODone (DESYREL) 50 MG tablet; Take 1 tablet (50 mg total) by mouth at bedtime as needed for sleep.  Dispense: 30 tablet; Refill: 3  2. Bipolar 2 disorder, major depressive episode  (HCC)  Continue- traZODone (DESYREL) 50 MG tablet; Take 1 tablet (50 mg total) by mouth at bedtime as needed for sleep.  Dispense: 30 tablet; Refill: 3    Follow up in 3 months Follow up with therapy     Shanna Cisco, NP 03/26/2022, 1:42 PM

## 2022-06-17 ENCOUNTER — Encounter (HOSPITAL_COMMUNITY): Payer: Self-pay | Admitting: Psychiatry

## 2022-06-17 ENCOUNTER — Telehealth (INDEPENDENT_AMBULATORY_CARE_PROVIDER_SITE_OTHER): Payer: No Payment, Other | Admitting: Psychiatry

## 2022-06-17 DIAGNOSIS — F411 Generalized anxiety disorder: Secondary | ICD-10-CM

## 2022-06-17 DIAGNOSIS — F3181 Bipolar II disorder: Secondary | ICD-10-CM

## 2022-06-17 MED ORDER — TRAZODONE HCL 50 MG PO TABS: 50.0000 mg | ORAL_TABLET | Freq: Every evening | ORAL | 3 refills | Status: DC | PRN

## 2022-06-17 NOTE — Progress Notes (Signed)
[name] MD/PA/NP OP Progress Note Virtual Visit via Video Note  I connected with Crystal Marshall on 06/17/22 at  3:00 PM EST by a video enabled telemedicine application and verified that I am speaking with the correct person using two identifiers.  Location: Patient: Home Provider: Clinic   I discussed the limitations of evaluation and management by telemedicine and the availability of in person appointments. The patient expressed understanding and agreed to proceed.  I provided 30 minutes of non-face-to-face time during this encounter.        06/17/2022 3:13 PM Crystal Marshall  MRN:  [id number]  Chief Complaint: "I am doing okay".  HPI: 27 year old female seen today for follow-up psychiatric evaluation.  She has a history of bipolar 2 disorder, depression, and anxiety. She is currently prescribed Trazodone 50 mg nightly as needed. She notes that trazodone is effective in managing her psychiatric conditions.  Today she was well groomed, pleasant, cooperative, and engaged in conversation.  She informed Probation officer that she has been doing okay.  She notes that her mood continues to be stable and reports she has minimal anxiety and depression.  Today provider conducted a GAD-7 and patient scored 12, at her last visit she scored a 4.  Provider also conducted PHQ-9 of he scored 9, at her last visit she scored a 3.  She endorses adequate sleep and appetite.  Today she denies SI/HI/VAH, mania, paranoia.    Patient informed Probation officer that recently her father got some of his toes removed but notes that he is recovering well.    No medication changes made today.  Patient agreeable to continue medication as prescribed.  No other concerns noted at this time. Visit Diagnosis:    ICD-10-CM   1. Generalized anxiety disorder  F41.1 traZODone (DESYREL) 50 MG tablet    2. Bipolar 2 disorder, major depressive episode (HCC)  F31.81 traZODone (DESYREL) 50 MG tablet      Past Psychiatric History:  Anxiety and depression   Past Medical History:  Past Medical History:  Diagnosis Date   Anxiety    Depression     Past Surgical History:  Procedure Laterality Date   TUMOR EXCISION Right    benign tumor removal    Family Psychiatric History: Unknown  Family History:  Family History  Problem Relation Age of Onset   Hypertension Mother    Diabetes Father    Hypertension Father    Hyperlipidemia Father    Diabetes Other     Social History:  Social History   Socioeconomic History   Marital status: Soil scientist    Spouse name: Not on file   Number of children: Not on file   Years of education: Not on file   Highest education level: Not on file  Occupational History   Not on file  Tobacco Use   Smoking status: Never   Smokeless tobacco: Never  Vaping Use   Vaping Use: Never used  Substance and Sexual Activity   Alcohol use: Yes   Drug use: Yes    Types: Marijuana   Sexual activity: Yes    Birth control/protection: Pill  Other Topics Concern   Not on file  Social History Narrative   Not on file   Social Determinants of Health   Financial Resource Strain: Not on file  Food Insecurity: Not on file  Transportation Needs: Not on file  Physical Activity: Not on file  Stress: Not on file  Social Connections: Not on file  Allergies: No Known Allergies  Metabolic Disorder Labs: No results found for: "HGBA1C", "MPG" No results found for: "PROLACTIN" No results found for: "CHOL", "TRIG", "HDL", "CHOLHDL", "VLDL", "LDLCALC" No results found for: "TSH"  Therapeutic Level Labs: No results found for: "LITHIUM" No results found for: "VALPROATE" No results found for: "CBMZ"  Current Medications: Current Outpatient Medications  Medication Sig Dispense Refill   ondansetron (ZOFRAN ODT) 8 MG disintegrating tablet Take 1 tablet (8 mg total) by mouth every 8 (eight) hours as needed for nausea or vomiting. 21 tablet 0   promethazine (PHENERGAN) 25 MG  suppository Place 1 suppository (25 mg total) rectally every 6 (six) hours as needed for nausea or vomiting. 24 each 0   traZODone (DESYREL) 50 MG tablet Take 1 tablet (50 mg total) by mouth at bedtime as needed for sleep. 30 tablet 3   No current facility-administered medications for this visit.     Musculoskeletal: Strength & Muscle Tone: within normal limits and telehealth visit Gait & Station: normal, telehealth visit Patient leans: N/A   Psychiatric Specialty Exam: Review of Systems  There were no vitals taken for this visit.There is no height or weight on file to calculate BMI.  General Appearance: Well Groomed  Eye Contact:  Good  Speech:  Clear and Coherent and Normal Rate  Volume:  Normal  Mood:  Euthymic  Affect:  Congruent  Thought Process:  Coherent, Goal Directed and Linear  Orientation:  Full (Time, Place, and Person)  Thought Content: WDL and Logical   Suicidal Thoughts:  No  Homicidal Thoughts:  No  Memory:  Immediate;   Good Recent;   Good Remote;   Good  Judgement:  Good  Insight:  Good  Psychomotor Activity:  Normal  Concentration:  Concentration: Good and Attention Span: Good  Recall:  Good  Fund of Knowledge: Good  Language: Good  Akathisia:  No  Handed:  Right  AIMS (if indicated): Not done  Assets:  Communication Skills Desire for Improvement Financial Resources/Insurance Housing Social Support  ADL's:  Intact  Cognition: WNL  Sleep:  Good    Assessment and Plan: Patient notes that she is doing well on her current medication regimen.  No medication changes made today.  Patient agreeable to taking medication as prescribed.    1. Generalized anxiety disorder  Continue- traZODone (DESYREL) 50 MG tablet; Take 1 tablet (50 mg total) by mouth at bedtime as needed for sleep.  Dispense: 30 tablet; Refill: 3  2. Bipolar 2 disorder, major depressive episode (HCC)  Continue- traZODone (DESYREL) 50 MG tablet; Take 1 tablet (50 mg total) by mouth at  bedtime as needed for sleep.  Dispense: 30 tablet; Refill: 3    Follow up in 3 months Follow up with therapy     [name], NP 06/17/2022, 3:13 PM

## 2022-08-28 ENCOUNTER — Telehealth (INDEPENDENT_AMBULATORY_CARE_PROVIDER_SITE_OTHER): Payer: No Payment, Other | Admitting: Psychiatry

## 2022-08-28 ENCOUNTER — Encounter (HOSPITAL_COMMUNITY): Payer: Self-pay | Admitting: Psychiatry

## 2022-08-28 DIAGNOSIS — F411 Generalized anxiety disorder: Secondary | ICD-10-CM

## 2022-08-28 DIAGNOSIS — F3181 Bipolar II disorder: Secondary | ICD-10-CM

## 2022-08-28 MED ORDER — FLUVOXAMINE MALEATE 50 MG PO TABS: 50.0000 mg | ORAL_TABLET | Freq: Every day | ORAL | 3 refills | Status: DC

## 2022-08-28 MED ORDER — TRAZODONE HCL 50 MG PO TABS: 50.0000 mg | ORAL_TABLET | Freq: Every evening | ORAL | 3 refills | Status: DC | PRN

## 2022-08-28 NOTE — Progress Notes (Signed)
BH MD/PA/NP OP Progress Note Virtual Visit via Video Note  I connected with Crystal Marshall on 08/28/22 at 10:30 AM EDT by a video enabled telemedicine application and verified that I am speaking with the correct person using two identifiers.  Location: Patient: Home Provider: Clinic   I discussed the limitations of evaluation and management by telemedicine and the availability of in person appointments. The patient expressed understanding and agreed to proceed.  I provided 30 minutes of non-face-to-face time during this encounter.        08/28/2022 10:21 AM Crystal Marshall  MRN:  [id number]  Chief Complaint: "I feel like I need more support".  HPI: 27 year old female seen today for follow-up psychiatric evaluation.  She has a history of bipolar 2 disorder, depression, and anxiety. She is currently prescribed Trazodone 50 mg nightly as needed. She notes that trazodone is somewhat effective in managing her psychiatric conditions.  Today she was well groomed, pleasant, cooperative, and engaged in conversation.  She informed Clinical research associate that she feels like she needs more support.  She informed Clinical research associate that trazodone is no longer managing her psychiatric conditions.  Patient endorses symptoms of hypomania such as fluctuations in mood, irritability, distractibility, and racing thoughts.  Patient informed Clinical research associate that she has been having negative thoughts.  She informed Clinical research associate that she is also mean to her family and boyfriend at times.  Patient was on Abilify and Lamictal in the past.  She notes that at this time she does not wish to restart Abilify.  Lamictal was discontinued by Clinical research associate because patient was not compliant with tapering of Lamictal.  Patient informed Clinical research associate that she felt Luvox was effective in the past.  Provider informed patient that Luvox can exacerbate mania.  She endorsed understanding however notes that she generally does not become manic.  Patient reports that she has  been more anxious.  She notes that she worries about her health, her father who has MRSA, and work.  Patient informed Clinical research associate that she travels an hour to work at the [employer].  She informed Clinical research associate that recently work has been stressful.  Today provider conducted GAD-7 and patient scored a 16, at her last visit she scored a 12.  Provider also conducted PHQ-9 patient scored a 12, at her last visit she scored a 9.  She notes that she has difficulty falling asleep.  When she is sleeping she notes that she sleeps approximately 6 hours.  Today she denies SI/HI/VAH or paranoia.  Today patient agreeable to increasing trazodone 50 mg nightly as needed to 50 to 100 mg nightly as needed.  Patient will restart Luvox 50 mg to help manage anxiety and depression.  At this time patient does not wish to start a mood stabilizer or antipsychotic.  Provider discussed the risk of serotonin syndrome as patient is on 2 antidepressants. Potential side effects of medication and risks vs benefits of treatment vs non-treatment were explained and discussed. All questions were answered.  No other concerns noted at this time.  Visit Diagnosis:    ICD-10-CM   1. Generalized anxiety disorder  F41.1 traZODone (DESYREL) 50 MG tablet    2. Bipolar 2 disorder, major depressive episode (HCC)  F31.81 fluvoxaMINE (LUVOX) 50 MG tablet    traZODone (DESYREL) 50 MG tablet      Past Psychiatric History: Anxiety and depression   Past Medical History:  Past Medical History:  Diagnosis Date   Anxiety    Depression     Past  Surgical History:  Procedure Laterality Date   TUMOR EXCISION Right    benign tumor removal    Family Psychiatric History: Unknown  Family History:  Family History  Problem Relation Age of Onset   Hypertension Mother    Diabetes Father    Hypertension Father    Hyperlipidemia Father    Diabetes Other     Social History:  Social History   Socioeconomic History   Marital status: Media planner     Spouse name: Not on file   Number of children: Not on file   Years of education: Not on file   Highest education level: Not on file  Occupational History   Not on file  Tobacco Use   Smoking status: Never   Smokeless tobacco: Never  Vaping Use   Vaping Use: Never used  Substance and Sexual Activity   Alcohol use: Yes   Drug use: Yes    Types: Marijuana   Sexual activity: Yes    Birth control/protection: Pill  Other Topics Concern   Not on file  Social History Narrative   Not on file   Social Determinants of Health   Financial Resource Strain: Not on file  Food Insecurity: Not on file  Transportation Needs: Not on file  Physical Activity: Not on file  Stress: Not on file  Social Connections: Not on file    Allergies: No Known Allergies  Metabolic Disorder Labs: No results found for: "HGBA1C", "MPG" No results found for: "PROLACTIN" No results found for: "CHOL", "TRIG", "HDL", "CHOLHDL", "VLDL", "LDLCALC" No results found for: "TSH"  Therapeutic Level Labs: No results found for: "LITHIUM" No results found for: "VALPROATE" No results found for: "CBMZ"  Current Medications: Current Outpatient Medications  Medication Sig Dispense Refill   fluvoxaMINE (LUVOX) 50 MG tablet Take 1 tablet (50 mg total) by mouth daily. 30 tablet 3   ondansetron (ZOFRAN ODT) 8 MG disintegrating tablet Take 1 tablet (8 mg total) by mouth every 8 (eight) hours as needed for nausea or vomiting. 21 tablet 0   promethazine (PHENERGAN) 25 MG suppository Place 1 suppository (25 mg total) rectally every 6 (six) hours as needed for nausea or vomiting. 24 each 0   traZODone (DESYREL) 50 MG tablet Take 1-2 tablets (50-100 mg total) by mouth at bedtime as needed for sleep. 60 tablet 3   No current facility-administered medications for this visit.     Musculoskeletal: Strength & Muscle Tone: within normal limits and telehealth visit Gait & Station: normal, telehealth visit Patient leans: N/A    Psychiatric Specialty Exam: Review of Systems  There were no vitals taken for this visit.There is no height or weight on file to calculate BMI.  General Appearance: Well Groomed  Eye Contact:  Good  Speech:  Clear and Coherent and Normal Rate  Volume:  Normal  Mood:  Anxious and Depressed  Affect:  Congruent and Tearful  Thought Process:  Coherent, Goal Directed and Linear  Orientation:  Full (Time, Place, and Person)  Thought Content: WDL and Logical   Suicidal Thoughts:  No  Homicidal Thoughts:  No  Memory:  Immediate;   Good Recent;   Good Remote;   Good  Judgement:  Good  Insight:  Good  Psychomotor Activity:  Normal  Concentration:  Concentration: Good and Attention Span: Good  Recall:  Good  Fund of Knowledge: Good  Language: Good  Akathisia:  No  Handed:  Right  AIMS (if indicated): Not done  Assets:  Communication Skills Desire  for Improvement Financial Resources/Insurance Housing Social Support  ADL's:  Intact  Cognition: WNL  Sleep:  Fair    Assessment and Plan: Patient endorses symptoms of hypomania, anxiety, depression, and disturbed sleep.Today patient agreeable to increasing trazodone 50 mg nightly as needed to 50 to 100 mg nightly as needed.  Patient will restart Luvox 50 mg to help manage anxiety and depression.  At this time patient does not wish to start a mood stabilizer or antipsychotic.  Provider discussed the risk of serotonin syndrome as patient is on 2 antidepressants.  1. Generalized anxiety disorder  Increased- traZODone (DESYREL) 50 MG tablet; Take 1-2 tablets (50-100 mg total) by mouth at bedtime as needed for sleep.  Dispense: 60 tablet; Refill: 3  2. Bipolar 2 disorder, major depressive episode (HCC)   Restart- fluvoxaMINE (LUVOX) 50 MG tablet; Take 1 tablet (50 mg total) by mouth daily.  Dispense: 30 tablet; Refill: 3 Increased- traZODone (DESYREL) 50 MG tablet; Take 1-2 tablets (50-100 mg total) by mouth at bedtime as needed for sleep.   Dispense: 60 tablet; Refill: 3    Follow up in 2.5 months Follow up with therapy     Shanna Cisco, NP 08/28/2022, 10:21 AM

## 2022-11-06 ENCOUNTER — Telehealth (HOSPITAL_COMMUNITY): Payer: No Payment, Other | Admitting: Psychiatry

## 2022-11-06 ENCOUNTER — Encounter (HOSPITAL_COMMUNITY): Payer: Self-pay

## 2022-12-09 ENCOUNTER — Encounter (HOSPITAL_COMMUNITY): Payer: Self-pay | Admitting: Psychiatry

## 2022-12-09 ENCOUNTER — Telehealth (INDEPENDENT_AMBULATORY_CARE_PROVIDER_SITE_OTHER): Payer: No Payment, Other | Admitting: Psychiatry

## 2022-12-09 DIAGNOSIS — F3181 Bipolar II disorder: Secondary | ICD-10-CM

## 2022-12-09 DIAGNOSIS — F411 Generalized anxiety disorder: Secondary | ICD-10-CM

## 2022-12-09 DIAGNOSIS — F4321 Adjustment disorder with depressed mood: Secondary | ICD-10-CM | POA: Diagnosis not present

## 2022-12-09 MED ORDER — TRAZODONE HCL 50 MG PO TABS: 50.0000 mg | ORAL_TABLET | Freq: Every evening | ORAL | 3 refills | Status: DC | PRN

## 2022-12-09 MED ORDER — FLUVOXAMINE MALEATE 50 MG PO TABS: 50.0000 mg | ORAL_TABLET | Freq: Every day | ORAL | 3 refills | Status: DC

## 2022-12-09 NOTE — Progress Notes (Signed)
BH MD/PA/NP OP Progress Note Virtual Visit via Video Note  I connected with Crystal Marshall on 12/09/22 at  1:30 PM EDT by a video enabled telemedicine application and verified that I am speaking with the correct person using two identifiers.  Location: Patient: Work Provider: Economist   I discussed the limitations of evaluation and management by telemedicine and the availability of in person appointments. The patient expressed understanding and agreed to proceed.  I provided 30 minutes of non-face-to-face time during this encounter.        12/09/2022 12:34 PM Crystal Marshall  MRN:  [id number]  Chief Complaint: "My cat died on 2023-01-02".  HPI: 27 year old female seen today for follow-up psychiatric evaluation.  She has a history of bipolar 2 disorder, depression, and anxiety. She is currently prescribed Trazodone 50 mg nightly as needed and Luvox 50 mg. She notes that trazodone is somewhat effective in managing her psychiatric conditions.  Today she was well groomed, pleasant, cooperative, and engaged in conversation.  She informed Clinical research associate that her cat died on 2023-01-02. She notes that she can't get her last moments out of her mind.  She informed Clinical research associate that her cat Oren Binet was treated for cancer however notes that the chemotherapy was ineffective.  When she took her to the vet they informed her that her cancer was more aggressive and she informed writer that she decided that we best to put her down.  Patient notes that she is having lots of regrets and wondering if she did the right thing.  To cope she informed writer that she has been drinking alcohol more and binge eating.  Provider informed patient that these activities are not beneficial for her mental health and recommended that she eat a balanced meal and not indulge with alcohol.  She endorsed understanding.    Patient notes that the above exacerbates her anxiety and depression.  Today provider conducted a GAD-7 and patient  scored a 15, at her last visit she scored a 16. Provider also conducted PHQ-9 patient scored a 19, at her last visit she scored a 12.  She notes that her sleep fluctuates from sleeping all day to sleeping 3 hours.  She informed Clinical research associate that she took 2 trazodone's recently to help manage sleep.  Today she denies SI/HI/VAH, paranoia.  Patient informed writer that for the last month she has been hypersexual with her significant other.  She denies other symptoms of mania.  Prior to her Passing away she informed mother that restarting Luvox was beneficial in managing her anxiety and depression.  Provider recommended patient discuss her grief with her therapist.  She was agreeable to this.  Patient referred to outpatient counseling for therapy.  At this time no medication changes made.  Patient agreeable to taking medication as prescribed.  No other concerns noted at this time.  Visit Diagnosis:    ICD-10-CM   1. Grief  F43.21 Ambulatory referral to Social Work    2. Bipolar 2 disorder, major depressive episode (HCC)  F31.81 fluvoxaMINE (LUVOX) 50 MG tablet    traZODone (DESYREL) 50 MG tablet    3. Generalized anxiety disorder  F41.1 Ambulatory referral to Social Work    traZODone (DESYREL) 50 MG tablet       Past Psychiatric History: Anxiety and depression   Past Medical History:  Past Medical History:  Diagnosis Date   Anxiety    Depression     Past Surgical History:  Procedure Laterality Date   TUMOR  EXCISION Right    benign tumor removal    Family Psychiatric History: Unknown  Family History:  Family History  Problem Relation Age of Onset   Hypertension Mother    Diabetes Father    Hypertension Father    Hyperlipidemia Father    Diabetes Other     Social History:  Social History   Socioeconomic History   Marital status: Media planner    Spouse name: Not on file   Number of children: Not on file   Years of education: Not on file   Highest education level: Not on  file  Occupational History   Not on file  Tobacco Use   Smoking status: Never   Smokeless tobacco: Never  Vaping Use   Vaping status: Never Used  Substance and Sexual Activity   Alcohol use: Yes   Drug use: Yes    Types: Marijuana   Sexual activity: Yes    Birth control/protection: Pill  Other Topics Concern   Not on file  Social History Narrative   Not on file   Social Determinants of Health   Financial Resource Strain: Not on file  Food Insecurity: Not on file  Transportation Needs: Not on file  Physical Activity: Not on file  Stress: Not on file  Social Connections: Unknown (09/06/2021)   Received from [facility]   Social Network    Social Network: Not on file    Allergies: No Known Allergies  Metabolic Disorder Labs: No results found for: "HGBA1C", "MPG" No results found for: "PROLACTIN" No results found for: "CHOL", "TRIG", "HDL", "CHOLHDL", "VLDL", "LDLCALC" No results found for: "TSH"  Therapeutic Level Labs: No results found for: "LITHIUM" No results found for: "VALPROATE" No results found for: "CBMZ"  Current Medications: Current Outpatient Medications  Medication Sig Dispense Refill   fluvoxaMINE (LUVOX) 50 MG tablet Take 1 tablet (50 mg total) by mouth daily. 30 tablet 3   ondansetron (ZOFRAN ODT) 8 MG disintegrating tablet Take 1 tablet (8 mg total) by mouth every 8 (eight) hours as needed for nausea or vomiting. 21 tablet 0   promethazine (PHENERGAN) 25 MG suppository Place 1 suppository (25 mg total) rectally every 6 (six) hours as needed for nausea or vomiting. 24 each 0   traZODone (DESYREL) 50 MG tablet Take 1-2 tablets (50-100 mg total) by mouth at bedtime as needed for sleep. 60 tablet 3   No current facility-administered medications for this visit.     Musculoskeletal: Strength & Muscle Tone: within normal limits and telehealth visit Gait & Station: normal, telehealth visit Patient leans: N/A   Psychiatric Specialty  Exam: Review of Systems  There were no vitals taken for this visit.There is no height or weight on file to calculate BMI.  General Appearance: Well Groomed  Eye Contact:  Good  Speech:  Clear and Coherent and Normal Rate  Volume:  Normal  Mood:  Anxious and Depressed  Affect:  Congruent and Tearful  Thought Process:  Coherent, Goal Directed and Linear  Orientation:  Full (Time, Place, and Person)  Thought Content: WDL and Logical   Suicidal Thoughts:  No  Homicidal Thoughts:  No  Memory:  Immediate;   Good Recent;   Good Remote;   Good  Judgement:  Good  Insight:  Good  Psychomotor Activity:  Normal  Concentration:  Concentration: Good and Attention Span: Good  Recall:  Good  Fund of Knowledge: Good  Language: Good  Akathisia:  No  Handed:  Right  AIMS (  if indicated): Not done  Assets:  Communication Skills Desire for Improvement Financial Resources/Insurance Housing Social Support  ADL's:  Intact  Cognition: WNL  Sleep:  Fair    Assessment and Plan: Patient informed Clinical research associate that she is grieving the loss of her cat.  To cope she reports that she has been binge drinking and eating.  Provider informed patient that these habits are not beneficial and recommended therapy.  Patient was agreeable to following up with outpatient counseling for therapy.  No medication changes made today.  Patient agreeable to continue medications prescribed.  1. Bipolar 2 disorder, major depressive episode (HCC)  Continue- fluvoxaMINE (LUVOX) 50 MG tablet; Take 1 tablet (50 mg total) by mouth daily.  Dispense: 30 tablet; Refill: 3 Continue- traZODone (DESYREL) 50 MG tablet; Take 1-2 tablets (50-100 mg total) by mouth at bedtime as needed for sleep.  Dispense: 60 tablet; Refill: 3  2. Generalized anxiety disorder  - Ambulatory referral to Social Work Continue- traZODone (DESYREL) 50 MG tablet; Take 1-2 tablets (50-100 mg total) by mouth at bedtime as needed for sleep.  Dispense: 60 tablet;  Refill: 3  3. Grief  - Ambulatory referral to Social Work     Follow up in 2.5 months Follow up with therapy     Shanna Cisco, NP 12/09/2022, 12:34 PM

## 2023-01-22 ENCOUNTER — Encounter (HOSPITAL_COMMUNITY): Payer: Self-pay

## 2023-01-22 ENCOUNTER — Ambulatory Visit (HOSPITAL_COMMUNITY): Payer: No Payment, Other | Admitting: Clinical

## 2023-03-01 ENCOUNTER — Encounter (HOSPITAL_COMMUNITY): Payer: Self-pay | Admitting: Psychiatry

## 2023-03-01 ENCOUNTER — Telehealth (INDEPENDENT_AMBULATORY_CARE_PROVIDER_SITE_OTHER): Payer: No Payment, Other | Admitting: Psychiatry

## 2023-03-01 DIAGNOSIS — F411 Generalized anxiety disorder: Secondary | ICD-10-CM

## 2023-03-01 DIAGNOSIS — F3181 Bipolar II disorder: Secondary | ICD-10-CM

## 2023-03-01 MED ORDER — TRAZODONE HCL 50 MG PO TABS: 50.0000 mg | ORAL_TABLET | Freq: Every evening | ORAL | 3 refills | Status: DC | PRN

## 2023-03-01 MED ORDER — FLUVOXAMINE MALEATE 50 MG PO TABS: 50.0000 mg | ORAL_TABLET | Freq: Every day | ORAL | 3 refills | Status: DC

## 2023-03-01 NOTE — Progress Notes (Signed)
BH MD/PA/NP OP Progress Note Virtual Visit via Video Note  I connected with Crystal Marshall on 03/01/23 at  2:00 PM EST by a video enabled telemedicine application and verified that I am speaking with the correct person using two identifiers.  Location: Patient: Work Provider: Economist   I discussed the limitations of evaluation and management by telemedicine and the availability of in person appointments. The patient expressed understanding and agreed to proceed.  I provided 30 minutes of non-face-to-face time during this encounter.        03/01/2023 1:59 PM Crystal Marshall  MRN:  [id number]  Chief Complaint: "I have gotten a lot better".  HPI: 27 year old female seen today for follow-up psychiatric evaluation.  She has a history of bipolar 2 disorder, depression, and anxiety. She is currently prescribed Trazodone 50 mg nightly as needed and Luvox 50 mg. She notes that trazodone is effective in managing her psychiatric conditions.  Today she was well groomed, pleasant, cooperative, and engaged in conversation.  She informed Clinical research associate that she has gotten a lot better.  She notes that she no longer grieves her cat and is able to manage her day-to-day life.  Since her last visit she informed writer that her anxiety and depression has improved.  Provider conducted a GAD-7 and patient scored 10, at her last visit she scored a 15.  Provider also conducted PHQ-9 and patient scored a 6, at her last visit she scored a 19.  She endorsed adequate sleep and appetite.  Today she denies SI/HI/VAH, mania, paranoia.    Patient informed Clinical research associate that now that she is able to cope with the loss of her cat she is not drinking alcohol excessively.  She denies illegal drug use.  understanding.    No medication changes made today.  Patient agreeable to continue medication as prescribed.  She will follow-up with outpatient counseling with therapy.  No other concerns noted at this time.  Visit  Diagnosis:    ICD-10-CM   1. Bipolar 2 disorder, major depressive episode (HCC)  F31.81 fluvoxaMINE (LUVOX) 50 MG tablet    traZODone (DESYREL) 50 MG tablet    2. Generalized anxiety disorder  F41.1 traZODone (DESYREL) 50 MG tablet        Past Psychiatric History: Anxiety and depression   Past Medical History:  Past Medical History:  Diagnosis Date   Anxiety    Depression     Past Surgical History:  Procedure Laterality Date   TUMOR EXCISION Right    benign tumor removal    Family Psychiatric History: Unknown  Family History:  Family History  Problem Relation Age of Onset   Hypertension Mother    Diabetes Father    Hypertension Father    Hyperlipidemia Father    Diabetes Other     Social History:  Social History   Socioeconomic History   Marital status: Media planner    Spouse name: Not on file   Number of children: Not on file   Years of education: Not on file   Highest education level: Not on file  Occupational History   Not on file  Tobacco Use   Smoking status: Never   Smokeless tobacco: Never  Vaping Use   Vaping status: Never Used  Substance and Sexual Activity   Alcohol use: Yes   Drug use: Yes    Types: Marijuana   Sexual activity: Yes    Birth control/protection: Pill  Other Topics Concern   Not on file  Social History Narrative   Not on file   Social Determinants of Health   Financial Resource Strain: Not on file  Food Insecurity: Not on file  Transportation Needs: Not on file  Physical Activity: Not on file  Stress: Not on file  Social Connections: Unknown (09/06/2021)   [facility]   Social Network    Social Network: Not on file    Allergies: No Known Allergies  Metabolic Disorder Labs: No results found for: "HGBA1C", "MPG" No results found for: "PROLACTIN" No results found for: "CHOL", "TRIG", "HDL", "CHOLHDL", "VLDL", "LDLCALC" No results found for: "TSH"  Therapeutic Level Labs: No  results found for: "LITHIUM" No results found for: "VALPROATE" No results found for: "CBMZ"  Current Medications: Current Outpatient Medications  Medication Sig Dispense Refill   fluvoxaMINE (LUVOX) 50 MG tablet Take 1 tablet (50 mg total) by mouth daily. 30 tablet 3   ondansetron (ZOFRAN ODT) 8 MG disintegrating tablet Take 1 tablet (8 mg total) by mouth every 8 (eight) hours as needed for nausea or vomiting. 21 tablet 0   promethazine (PHENERGAN) 25 MG suppository Place 1 suppository (25 mg total) rectally every 6 (six) hours as needed for nausea or vomiting. 24 each 0   traZODone (DESYREL) 50 MG tablet Take 1-2 tablets (50-100 mg total) by mouth at bedtime as needed for sleep. 60 tablet 3   No current facility-administered medications for this visit.     Musculoskeletal: Strength & Muscle Tone: within normal limits and telehealth visit Gait & Station: normal, telehealth visit Patient leans: N/A   Psychiatric Specialty Exam: Review of Systems  There were no vitals taken for this visit.There is no height or weight on file to calculate BMI.  General Appearance: Well Groomed  Eye Contact:  Good  Speech:  Clear and Coherent and Normal Rate  Volume:  Normal  Mood:  Euthymic  Affect:  Appropriate and Congruent  Thought Process:  Coherent, Goal Directed and Linear  Orientation:  Full (Time, Place, and Person)  Thought Content: WDL and Logical   Suicidal Thoughts:  No  Homicidal Thoughts:  No  Memory:  Immediate;   Good Recent;   Good Remote;   Good  Judgement:  Good  Insight:  Good  Psychomotor Activity:  Normal  Concentration:  Concentration: Good and Attention Span: Good  Recall:  Good  Fund of Knowledge: Good  Language: Good  Akathisia:  No  Handed:  Right  AIMS (if indicated): Not done  Assets:  Communication Skills Desire for Improvement Financial Resources/Insurance Housing Social Support  ADL's:  Intact  Cognition: WNL  Sleep:  Good    Assessment and Plan:  Patient informed Clinical research associate that she her anxiety depression has improved since her last visit.  She notes that she is able to now cope with the loss of her cat and is not grieving any longer.No medication changes made today.  Patient agreeable to continue medication as prescribed.  She will follow-up with outpatient counseling with therapy.  1. Bipolar 2 disorder, major depressive episode (HCC)  Continue- fluvoxaMINE (LUVOX) 50 MG tablet; Take 1 tablet (50 mg total) by mouth daily.  Dispense: 30 tablet; Refill: 3 Continue- traZODone (DESYREL) 50 MG tablet; Take 1-2 tablets (50-100 mg total) by mouth at bedtime as needed for sleep.  Dispense: 60 tablet; Refill: 3  2. Generalized anxiety disorder  Continue- traZODone (DESYREL) 50 MG tablet; Take 1-2 tablets (50-100 mg total) by mouth at bedtime as needed for sleep.  Dispense: 60 tablet; Refill: 3       Follow up in 2.5 months Follow up with therapy     Shanna Cisco, NP 03/01/2023, 1:59 PM

## 2023-05-12 ENCOUNTER — Telehealth (HOSPITAL_COMMUNITY): Payer: No Payment, Other | Admitting: Psychiatry

## 2023-05-12 ENCOUNTER — Encounter (HOSPITAL_COMMUNITY): Payer: Self-pay | Admitting: Psychiatry

## 2023-05-12 DIAGNOSIS — F3181 Bipolar II disorder: Secondary | ICD-10-CM

## 2023-05-12 DIAGNOSIS — F411 Generalized anxiety disorder: Secondary | ICD-10-CM

## 2023-05-12 MED ORDER — TRAZODONE HCL 50 MG PO TABS: 50.0000 mg | ORAL_TABLET | Freq: Every evening | ORAL | 3 refills | Status: DC | PRN

## 2023-05-12 MED ORDER — FLUVOXAMINE MALEATE 50 MG PO TABS: 50.0000 mg | ORAL_TABLET | Freq: Every day | ORAL | 3 refills | Status: DC

## 2023-05-12 NOTE — Progress Notes (Signed)
 BH MD/PA/NP OP Progress Note Virtual Visit via Video Note  I connected with Crystal Marshall on 05/12/23 at  2:00 PM EST by a video enabled telemedicine application and verified that I am speaking with the correct person using two identifiers.  Location: Patient: Work Provider: Economist   I discussed the limitations of evaluation and management by telemedicine and the availability of in person appointments. The patient expressed understanding and agreed to proceed.  I provided 30 minutes of non-face-to-face time during this encounter.        05/12/2023 1:56 PM Crystal Marshall  MRN:  [id number]  Chief Complaint: "I am doing okay".  HPI: 28 year old female seen today for follow-up psychiatric evaluation.  She has a history of bipolar 2 disorder, depression, and anxiety. She is currently prescribed Trazodone  50 mg nightly as needed and Luvox  50 mg. She notes that trazodone  is effective in managing her psychiatric conditions.  Today she was well groomed, pleasant, cooperative, and engaged in conversation.  She informed Clinical research associate that she has been doing okay. She reports that her mood, anxiety, and depression continues to be well managed. Today provider conducted a GAD-7 and patient scored 6, at her last visit she scored a 10.  Provider also conducted PHQ-9 and patient scored a 6, at her last visit she scored a 6.  She endorsed adequate sleep and appetite.  Today she denies SI/HI/VAH, mania, paranoia.      No medication changes made today.  Patient agreeable to continue medication as prescribed.  She will follow-up with outpatient counseling with therapy.  No other concerns noted at this time.  Visit Diagnosis:    ICD-10-CM   1. Bipolar 2 disorder, major depressive episode (HCC)  F31.81 traZODone  (DESYREL ) 50 MG tablet    fluvoxaMINE  (LUVOX ) 50 MG tablet    2. Generalized anxiety disorder  F41.1 traZODone  (DESYREL ) 50 MG tablet         Past Psychiatric History: Anxiety  and depression   Past Medical History:  Past Medical History:  Diagnosis Date   Anxiety    Depression     Past Surgical History:  Procedure Laterality Date   TUMOR EXCISION Right    benign tumor removal    Family Psychiatric History: Unknown  Family History:  Family History  Problem Relation Age of Onset   Hypertension Mother    Diabetes Father    Hypertension Father    Hyperlipidemia Father    Diabetes Other     Social History:  Social History   Socioeconomic History   Marital status: Media planner    Spouse name: Not on file   Number of children: Not on file   Years of education: Not on file   Highest education level: Not on file  Occupational History   Not on file  Tobacco Use   Smoking status: Never   Smokeless tobacco: Never  Vaping Use   Vaping status: Never Used  Substance and Sexual Activity   Alcohol use: Yes   Drug use: Yes    Types: Marijuana   Sexual activity: Yes    Birth control/protection: Pill  Other Topics Concern   Not on file  Social History Narrative   Not on file   Social Drivers of Health   Financial Resource Strain: Not on file  Food Insecurity: Not on file  Transportation Needs: Not on file  Physical Activity: Not on file  Stress: Not on file  Social Connections: Unknown (09/06/2021)   Received  from [employer], [facility]   Social Network    Social Network: Not on file    Allergies: No Known Allergies  Metabolic Disorder Labs: No results found for: "HGBA1C", "MPG" No results found for: "PROLACTIN" No results found for: "CHOL", "TRIG", "HDL", "CHOLHDL", "VLDL", "LDLCALC" No results found for: "TSH"  Therapeutic Level Labs: No results found for: "LITHIUM" No results found for: "VALPROATE" No results found for: "CBMZ"  Current Medications: Current Outpatient Medications  Medication Sig Dispense Refill   fluvoxaMINE  (LUVOX ) 50 MG tablet Take 1 tablet (50 mg total) by mouth daily. 30 tablet 3   ondansetron   (ZOFRAN  ODT) 8 MG disintegrating tablet Take 1 tablet (8 mg total) by mouth every 8 (eight) hours as needed for nausea or vomiting. 21 tablet 0   promethazine  (PHENERGAN ) 25 MG suppository Place 1 suppository (25 mg total) rectally every 6 (six) hours as needed for nausea or vomiting. 24 each 0   traZODone  (DESYREL ) 50 MG tablet Take 1-2 tablets (50-100 mg total) by mouth at bedtime as needed for sleep. 60 tablet 3   No current facility-administered medications for this visit.     Musculoskeletal: Strength & Muscle Tone: within normal limits and telehealth visit Gait & Station: normal, telehealth visit Patient leans: N/A   Psychiatric Specialty Exam: Review of Systems  There were no vitals taken for this visit.There is no height or weight on file to calculate BMI.  General Appearance: Well Groomed  Eye Contact:  Good  Speech:  Clear and Coherent and Normal Rate  Volume:  Normal  Mood:  Euthymic  Affect:  Appropriate and Congruent  Thought Process:  Coherent, Goal Directed and Linear  Orientation:  Full (Time, Place, and Person)  Thought Content: WDL and Logical   Suicidal Thoughts:  No  Homicidal Thoughts:  No  Memory:  Immediate;   Good Recent;   Good Remote;   Good  Judgement:  Good  Insight:  Good  Psychomotor Activity:  Normal  Concentration:  Concentration: Good and Attention Span: Good  Recall:  Good  Fund of Knowledge: Good  Language: Good  Akathisia:  No  Handed:  Right  AIMS (if indicated): Not done  Assets:  Communication Skills Desire for Improvement Financial Resources/Insurance Housing Social Support  ADL's:  Intact  Cognition: WNL  Sleep:  Good    Assessment and Plan: Patient informed Clinical research associate that she is doing well on her current medication regimen. No medication changes made today.  Patient agreeable to continue medication as prescribed.  She will follow-up with outpatient counseling with therapy.  1. Bipolar 2 disorder, major depressive episode  (HCC)  Continue- fluvoxaMINE  (LUVOX ) 50 MG tablet; Take 1 tablet (50 mg total) by mouth daily.  Dispense: 30 tablet; Refill: 3 Continue- traZODone  (DESYREL ) 50 MG tablet; Take 1-2 tablets (50-100 mg total) by mouth at bedtime as needed for sleep.  Dispense: 60 tablet; Refill: 3  2. Generalized anxiety disorder  Continue- traZODone  (DESYREL ) 50 MG tablet; Take 1-2 tablets (50-100 mg total) by mouth at bedtime as needed for sleep.  Dispense: 60 tablet; Refill: 3       Follow up in 2.5 months Follow up with therapy     Arlyne Bering, NP 05/12/2023, 1:56 PM

## 2023-05-24 ENCOUNTER — Telehealth (HOSPITAL_COMMUNITY): Payer: Self-pay | Admitting: Psychiatry

## 2023-05-24 ENCOUNTER — Other Ambulatory Visit (HOSPITAL_COMMUNITY): Payer: Self-pay | Admitting: Psychiatry

## 2023-05-24 DIAGNOSIS — F3181 Bipolar II disorder: Secondary | ICD-10-CM

## 2023-05-24 MED ORDER — HYDROXYZINE HCL 10 MG PO TABS: 10.0000 mg | ORAL_TABLET | Freq: Three times a day (TID) | ORAL | 3 refills | Status: DC | PRN

## 2023-05-24 MED ORDER — FLUVOXAMINE MALEATE 100 MG PO TABS: 100.0000 mg | ORAL_TABLET | Freq: Every day | ORAL | 3 refills | Status: DC

## 2023-05-24 NOTE — Telephone Encounter (Signed)
Patient notes that today she had a panic attack that lasted about 30 minutes. She notes that she also had one last night. She notes that she is worried about taxes and her grandmother.  Recently patient notes that her mood has been fluctuating.  She does inform that she was recently on her menstrual cycle.  At times she notes that she does impulsive things such as speaking to people online and watching pornography in excess.  Patient notes that when she is on her menstrual cycle she feels more depressed and at times have suicidal ideations.  She denies wanting to harm self today.  Provider recommended increasing Luvox to 100 mg daily to help manage depressive state.  Provider also recommended starting hydroxyzine 10 mg 3 times daily as needed to help manage anxiety.  She endorsed understanding and agreed.  Provider informed patient that if mood does not regulate with the medication adjustment that a mood stabilizer should be retrialed.  Patient was on Lamictal however forgot to take it regularly.  She has also tried Abilify but stopped it.Potential side effects of medication and risks vs benefits of treatment vs non-treatment were explained and discussed. All questions were answered.  No other concerns at this time.

## 2023-07-21 ENCOUNTER — Encounter (HOSPITAL_COMMUNITY): Payer: Self-pay | Admitting: Psychiatry

## 2023-07-21 ENCOUNTER — Telehealth (HOSPITAL_COMMUNITY): Payer: No Payment, Other | Admitting: Psychiatry

## 2023-07-21 DIAGNOSIS — R632 Polyphagia: Secondary | ICD-10-CM | POA: Diagnosis not present

## 2023-07-21 DIAGNOSIS — F411 Generalized anxiety disorder: Secondary | ICD-10-CM | POA: Diagnosis not present

## 2023-07-21 DIAGNOSIS — F3181 Bipolar II disorder: Secondary | ICD-10-CM

## 2023-07-21 MED ORDER — TRAZODONE HCL 50 MG PO TABS: 50.0000 mg | ORAL_TABLET | Freq: Every evening | ORAL | 3 refills | Status: DC | PRN

## 2023-07-21 MED ORDER — HYDROXYZINE HCL 10 MG PO TABS: 10.0000 mg | ORAL_TABLET | Freq: Three times a day (TID) | ORAL | 3 refills | Status: DC | PRN

## 2023-07-21 MED ORDER — FLUVOXAMINE MALEATE 100 MG PO TABS
100.0000 mg | ORAL_TABLET | Freq: Every day | ORAL | 3 refills | Status: DC
Start: 2023-07-21 — End: 2023-09-29

## 2023-07-21 NOTE — Progress Notes (Signed)
 BH MD/PA/NP OP Progress Note Virtual Visit via Video Note  I connected with Crystal Marshall on 07/21/23 at  1:00 PM EDT by a video enabled telemedicine application and verified that I am speaking with the correct person using two identifiers.  Location: Patient: Work Provider: Economist   I discussed the limitations of evaluation and management by telemedicine and the availability of in person appointments. The patient expressed understanding and agreed to proceed.  I provided 30 minutes of non-face-to-face time during this encounter.        07/21/2023 12:12 PM Crystal Marshall  MRN:  [id number]  Chief Complaint: "I binge eat at times but i'm doing really good".  HPI: 28 year old female seen today for follow-up psychiatric evaluation.  She has a history of bipolar 2 disorder, depression, and anxiety. She is currently prescribed Trazodone 50 mg nightly as needed and Luvox 100 mg. She notes that trazodone is effective in managing her psychiatric conditions.  Today she was well groomed, pleasant, cooperative, and engaged in conversation.  She informed Clinical research associate that she has been really good. She notes that her mood is stable and reports she has minimal anxiety and depression. Patient notes that recently she took a work trip to [place] in place of her boss who had a cardiac surgery. 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 She does note that this was stressful. She notes that it led her to binge drink. She notes that she has not drank in 3 weeks. Patient notes that she binge eats well. She notes that she eats in excess after dinner mostly. She reports eating until she experiences stomach pain. She denies purging behavior. Patient reports that she has gained 10 pounds since her last visit. She notes that she attempts to binge on healthy food such as lettuce. Provider asked patient if her binge eating/drinking happens when she is more stressed. She notes that it  does. Provider reccommended therapy (CBT). At this time patient notes that she is not interested.   Patient notes that the above worsens her anxiety and depression but notes that she is able to cope with it. Today provider conducted a GAD-7 and patient scored 10, at her last visit she scored a 6.  Provider also conducted PHQ-9 and patient scored a 9, at her last visit she scored a 6.  She endorsed adequate sleep and appetite.  Today she denies SI/HI/VAH, mania, paranoia.      No medication changes made today.  Patient agreeable to continue medication as prescribed.  She reports that she will consider counseling in the future.  No other concerns noted at this time.  Visit Diagnosis:    ICD-10-CM   1. Binge eating  R63.2 hydrOXYzine (ATARAX) 10 MG tablet    2. Bipolar 2 disorder, major depressive episode (HCC)  F31.81 fluvoxaMINE (LUVOX) 100 MG tablet    traZODone (DESYREL) 50 MG tablet    3. Generalized anxiety disorder  F41.1 traZODone (DESYREL) 50 MG tablet          Past Psychiatric History: Anxiety and depression   Past Medical History:  Past Medical History:  Diagnosis Date   Anxiety    Depression     Past Surgical History:  Procedure Laterality Date   TUMOR EXCISION Right    benign tumor removal    Family Psychiatric History: Unknown  Family History:  Family History  Problem Relation Age of Onset   Hypertension Mother    Diabetes  Father    Hypertension Father    Hyperlipidemia Father    Diabetes Other     Social History:  Social History   Socioeconomic History   Marital status: Media planner    Spouse name: Not on file   Number of children: Not on file   Years of education: Not on file   Highest education level: Not on file  Occupational History   Not on file  Tobacco Use   Smoking status: Never   Smokeless tobacco: Never  Vaping Use   Vaping status: Never Used  Substance and Sexual Activity   Alcohol use: Yes   Drug use: Yes    Types:  Marijuana   Sexual activity: Yes    Birth control/protection: Pill  Other Topics Concern   Not on file  Social History Narrative   Not on file   Social Drivers of Health   Financial Resource Strain: Not on file  Food Insecurity: Not on file  Transportation Needs: Not on file  Physical Activity: Not on file  Stress: Not on file  Social Connections: Unknown (09/06/2021)   Received from [facility], [facility]   Social Network    Social Network: Not on file    Allergies: No Known Allergies  Metabolic Disorder Labs: No results found for: "HGBA1C", "MPG" No results found for: "PROLACTIN" No results found for: "CHOL", "TRIG", "HDL", "CHOLHDL", "VLDL", "LDLCALC" No results found for: "TSH"  Therapeutic Level Labs: No results found for: "LITHIUM" No results found for: "VALPROATE" No results found for: "CBMZ"  Current Medications: Current Outpatient Medications  Medication Sig Dispense Refill   fluvoxaMINE (LUVOX) 100 MG tablet Take 1 tablet (100 mg total) by mouth daily. 30 tablet 3   hydrOXYzine (ATARAX) 10 MG tablet Take 1 tablet (10 mg total) by mouth 3 (three) times daily as needed. 90 tablet 3   ondansetron (ZOFRAN ODT) 8 MG disintegrating tablet Take 1 tablet (8 mg total) by mouth every 8 (eight) hours as needed for nausea or vomiting. 21 tablet 0   promethazine (PHENERGAN) 25 MG suppository Place 1 suppository (25 mg total) rectally every 6 (six) hours as needed for nausea or vomiting. 24 each 0   traZODone (DESYREL) 50 MG tablet Take 1-2 tablets (50-100 mg total) by mouth at bedtime as needed for sleep. 60 tablet 3   No current facility-administered medications for this visit.     Musculoskeletal: Strength & Muscle Tone: within normal limits and telehealth visit Gait & Station: normal, telehealth visit Patient leans: N/A   Psychiatric Specialty Exam: Review of Systems  There were no vitals taken for this visit.There is no height or weight on file to calculate  BMI.  General Appearance: Well Groomed  Eye Contact:  Good  Speech:  Clear and Coherent and Normal Rate  Volume:  Normal  Mood:  Anxious, Depressed, and mild  Affect:  Appropriate and Congruent  Thought Process:  Coherent, Goal Directed and Linear  Orientation:  Full (Time, Place, and Person)  Thought Content: WDL and Logical   Suicidal Thoughts:  No  Homicidal Thoughts:  No  Memory:  Immediate;   Good Recent;   Good Remote;   Good  Judgement:  Good  Insight:  Good  Psychomotor Activity:  Normal  Concentration:  Concentration: Good and Attention Span: Good  Recall:  Good  Fund of Knowledge: Good  Language: Good  Akathisia:  No  Handed:  Right  AIMS (if indicated): Not done  Assets:  Communication Skills Desire  for Improvement Financial Resources/Insurance Housing Social Support  ADL's:  Intact  Cognition: WNL  Sleep:  Good    Assessment and Plan: Patient informed Clinical research associate that she has been binge eating. She also has increased anxiety and depression but notes that she can cope with it. At this time she is not interested in CBT therapy. No medication changes made today. Patient agreeable to continue medications as prescribed.   1. Bipolar 2 disorder, major depressive episode (HCC)  Continue- fluvoxaMINE (LUVOX) 100 MG tablet; Take 1 tablet (100 mg total) by mouth daily.  Dispense: 30 tablet; Refill: 3 Continue- traZODone (DESYREL) 50 MG tablet; Take 1-2 tablets (50-100 mg total) by mouth at bedtime as needed for sleep.  Dispense: 60 tablet; Refill: 3  2. Generalized anxiety disorder  Continue- traZODone (DESYREL) 50 MG tablet; Take 1-2 tablets (50-100 mg total) by mouth at bedtime as needed for sleep.  Dispense: 60 tablet; Refill: 3  3. Binge eating (Primary)  Continue- hydrOXYzine (ATARAX) 10 MG tablet; Take 1 tablet (10 mg total) by mouth 3 (three) times daily as needed.  Dispense: 90 tablet; Refill: 3       Follow up in 2.5 months Follow up with  therapy     Shanna Cisco, NP 07/21/2023, 12:12 PM

## 2023-09-29 ENCOUNTER — Telehealth (INDEPENDENT_AMBULATORY_CARE_PROVIDER_SITE_OTHER): Admitting: Psychiatry

## 2023-09-29 ENCOUNTER — Encounter (HOSPITAL_COMMUNITY): Payer: Self-pay | Admitting: Psychiatry

## 2023-09-29 DIAGNOSIS — F411 Generalized anxiety disorder: Secondary | ICD-10-CM

## 2023-09-29 DIAGNOSIS — R632 Polyphagia: Secondary | ICD-10-CM | POA: Diagnosis not present

## 2023-09-29 DIAGNOSIS — F3181 Bipolar II disorder: Secondary | ICD-10-CM | POA: Diagnosis not present

## 2023-09-29 MED ORDER — HYDROXYZINE HCL 10 MG PO TABS: 10.0000 mg | ORAL_TABLET | Freq: Three times a day (TID) | ORAL | 3 refills | Status: DC | PRN

## 2023-09-29 MED ORDER — TRAZODONE HCL 50 MG PO TABS
50.0000 mg | ORAL_TABLET | Freq: Every evening | ORAL | 3 refills | Status: DC | PRN
Start: 2023-09-29 — End: 2023-11-25

## 2023-09-29 MED ORDER — FLUVOXAMINE MALEATE 100 MG PO TABS: 100.0000 mg | ORAL_TABLET | Freq: Every day | ORAL | 3 refills | Status: DC

## 2023-09-29 NOTE — Progress Notes (Signed)
 BH MD/PA/NP OP Progress Note Virtual Visit via Video Note  I connected with Crystal Marshall on 09/29/23 at 11:30 AM EDT by a video enabled telemedicine application and verified that I am speaking with the correct person using two identifiers.  Location: Patient: Work Provider: Economist   I discussed the limitations of evaluation and management by telemedicine and the availability of in person appointments. The patient expressed understanding and agreed to proceed.  I provided 30 minutes of non-face-to-face time during this encounter.        09/29/2023 11:49 AM Crystal Marshall  MRN:  [id number]  Chief Complaint: "This has been a long week".  HPI: 28 year old female seen today for follow-up psychiatric evaluation.  She has a history of bipolar 2 disorder, depression, and anxiety. She is currently prescribed hydroxyzine  10 mg three times daily as needed, Trazodone  50 mg nightly as needed and Luvox  100 mg. She notes that her medications are effective in managing her psychiatric conditions.  Today she was well groomed, pleasant, cooperative, and engaged in conversation.  She informed Clinical research associate that she has been having a long week.  She notes that she has been going into the office more as her boss is on vacation and in need of coverage.  Patient notes that because of this her sleep has been impacted.  During the week she notes that she sleeps 4 to 5 hours and on the weekends she reports that she sleeps 10 to 12 hours.  She inform her that she feels this is situational and notes that she is able to cope with it.  Patient informed Clinical research associate that mentally her anxiety and depression has somewhat increased.  She notes that she is worried about family stressors and inform her that she has had to set boundaries with her father.   Today provider conducted a GAD-7 and patient scored 11, at her last visit she scored a 10.  Provider also conducted PHQ-9 and patient scored a 9, at her last visit  she scored a 9.  Patient reports that to cope she binge eats.  Provider informed patient of this was not safe and encouraged therapy.  Provider informed patient that she should eat to live not to cope with stressed.  She endorsed understanding and notes that she will try.  Today she denies SI/HI/VAH, mania, paranoia.      Patient informed that she is able to cope with the above.  No medication changes made today.  Patient agreeable to continue medication as prescribed.  She reports that she will consider counseling in the future.  No other concerns noted at this time.  Visit Diagnosis:    ICD-10-CM   1. Binge eating  R63.2 hydrOXYzine  (ATARAX ) 10 MG tablet    2. Bipolar 2 disorder, major depressive episode (HCC)  F31.81 fluvoxaMINE  (LUVOX ) 100 MG tablet    traZODone  (DESYREL ) 50 MG tablet    3. Generalized anxiety disorder  F41.1 traZODone  (DESYREL ) 50 MG tablet           Past Psychiatric History: Anxiety and depression   Past Medical History:  Past Medical History:  Diagnosis Date   Anxiety    Depression     Past Surgical History:  Procedure Laterality Date   TUMOR EXCISION Right    benign tumor removal    Family Psychiatric History: Unknown  Family History:  Family History  Problem Relation Age of Onset   Hypertension Mother    Diabetes Father    Hypertension  Father    Hyperlipidemia Father    Diabetes Other     Social History:  Social History   Socioeconomic History   Marital status: Media planner    Spouse name: Not on file   Number of children: Not on file   Years of education: Not on file   Highest education level: Not on file  Occupational History   Not on file  Tobacco Use   Smoking status: Never   Smokeless tobacco: Never  Vaping Use   Vaping status: Never Used  Substance and Sexual Activity   Alcohol use: Yes   Drug use: Yes    Types: Marijuana   Sexual activity: Yes    Birth control/protection: Pill  Other Topics Concern   Not on file   Social History Narrative   Not on file   Social Drivers of Health   Financial Resource Strain: Not on file  Food Insecurity: Not on file  Transportation Needs: Not on file  Physical Activity: Not on file  Stress: Not on file  Social Connections: Unknown (09/06/2021)   Received from [facility], [facility]   Social Network    Social Network: Not on file    Allergies: No Known Allergies  Metabolic Disorder Labs: No results found for: "HGBA1C", "MPG" No results found for: "PROLACTIN" No results found for: "CHOL", "TRIG", "HDL", "CHOLHDL", "VLDL", "LDLCALC" No results found for: "TSH"  Therapeutic Level Labs: No results found for: "LITHIUM" No results found for: "VALPROATE" No results found for: "CBMZ"  Current Medications: Current Outpatient Medications  Medication Sig Dispense Refill   fluvoxaMINE  (LUVOX ) 100 MG tablet Take 1 tablet (100 mg total) by mouth daily. 30 tablet 3   hydrOXYzine  (ATARAX ) 10 MG tablet Take 1 tablet (10 mg total) by mouth 3 (three) times daily as needed. 90 tablet 3   ondansetron  (ZOFRAN  ODT) 8 MG disintegrating tablet Take 1 tablet (8 mg total) by mouth every 8 (eight) hours as needed for nausea or vomiting. 21 tablet 0   promethazine  (PHENERGAN ) 25 MG suppository Place 1 suppository (25 mg total) rectally every 6 (six) hours as needed for nausea or vomiting. 24 each 0   traZODone  (DESYREL ) 50 MG tablet Take 1-2 tablets (50-100 mg total) by mouth at bedtime as needed for sleep. 60 tablet 3   No current facility-administered medications for this visit.     Musculoskeletal: Strength & Muscle Tone: within normal limits and telehealth visit Gait & Station: normal, telehealth visit Patient leans: N/A   Psychiatric Specialty Exam: Review of Systems  There were no vitals taken for this visit.There is no height or weight on file to calculate BMI.  General Appearance: Well Groomed  Eye Contact:  Good  Speech:  Clear and Coherent and Normal Rate   Volume:  Normal  Mood:  Anxious, Depressed, and mild  Affect:  Appropriate and Congruent  Thought Process:  Coherent, Goal Directed and Linear  Orientation:  Full (Time, Place, and Person)  Thought Content: WDL and Logical   Suicidal Thoughts:  No  Homicidal Thoughts:  No  Memory:  Immediate;   Good Recent;   Good Remote;   Good  Judgement:  Good  Insight:  Good  Psychomotor Activity:  Normal  Concentration:  Concentration: Good and Attention Span: Good  Recall:  Good  Fund of Knowledge: Good  Language: Good  Akathisia:  No  Handed:  Right  AIMS (if indicated): Not done  Assets:  Communication Skills Desire for Improvement Financial Resources/Insurance Housing  Social Support  ADL's:  Intact  Cognition: WNL  Sleep:  Fair    Assessment and Plan: Patient informed Clinical research associate that she has been binge eating to cope with increased anxiety and depression.  Provider recommended therapy.  Patient notes she will consider it in the future.  She informed Clinical research associate that she is able to cope with current stressors.  No medication changes made.  Patient agreeable to continue medication as prescribed.  1. Binge eating  Continue- hydrOXYzine  (ATARAX ) 10 MG tablet; Take 1 tablet (10 mg total) by mouth 3 (three) times daily as needed.  Dispense: 90 tablet; Refill: 3 Continue- fluvoxaMINE  (LUVOX ) 100 MG tablet; Take 1 tablet (100 mg total) by mouth daily.  Dispense: 30 tablet; Refill: 3  2. Bipolar 2 disorder, major depressive episode (HCC)  Continue- traZODone  (DESYREL ) 50 MG tablet; Take 1-2 tablets (50-100 mg total) by mouth at bedtime as needed for sleep.  Dispense: 60 tablet; Refill: 3  3. Generalized anxiety disorder  Continue- hydrOXYzine  (ATARAX ) 10 MG tablet; Take 1 tablet (10 mg total) by mouth 3 (three) times daily as needed.  Dispense: 90 tablet; Refill: 3 Continue- fluvoxaMINE  (LUVOX ) 100 MG tablet; Take 1 tablet (100 mg total) by mouth daily.  Dispense: 30 tablet; Refill: 3 Continue-  traZODone  (DESYREL ) 50 MG tablet; Take 1-2 tablets (50-100 mg total) by mouth at bedtime as needed for sleep.  Dispense: 60 tablet; Refill: 3       Follow up in 2.5 months Follow up with therapy     Arlyne Bering, NP 09/29/2023, 11:49 AM

## 2023-11-22 ENCOUNTER — Encounter (HOSPITAL_COMMUNITY): Payer: Self-pay

## 2023-11-25 ENCOUNTER — Telehealth (INDEPENDENT_AMBULATORY_CARE_PROVIDER_SITE_OTHER): Admitting: Family

## 2023-11-25 DIAGNOSIS — F411 Generalized anxiety disorder: Secondary | ICD-10-CM

## 2023-11-25 DIAGNOSIS — R632 Polyphagia: Secondary | ICD-10-CM | POA: Diagnosis not present

## 2023-11-25 DIAGNOSIS — F3181 Bipolar II disorder: Secondary | ICD-10-CM

## 2023-11-25 MED ORDER — HYDROXYZINE HCL 10 MG PO TABS: 10.0000 mg | ORAL_TABLET | Freq: Three times a day (TID) | ORAL | 3 refills | Status: DC | PRN

## 2023-11-25 MED ORDER — TRAZODONE HCL 50 MG PO TABS: 50.0000 mg | ORAL_TABLET | Freq: Every evening | ORAL | 3 refills | Status: DC | PRN

## 2023-11-25 MED ORDER — FLUVOXAMINE MALEATE 100 MG PO TABS
100.0000 mg | ORAL_TABLET | Freq: Every day | ORAL | 3 refills | Status: DC
Start: 2023-11-25 — End: 2024-02-23

## 2023-11-25 NOTE — Progress Notes (Signed)
 [facility] MD/PA/NP OP Progress Note Virtual Visit via Video Note  I connected with Crystal Marshall on 11/25/23 at  3:00 PM EDT by a video enabled telemedicine application and verified that I am speaking with the correct person using two identifiers.  Location: Patient: Work Provider: Economist   I discussed the limitations of evaluation and management by telemedicine and the availability of in person appointments. The patient expressed understanding and agreed to proceed.  I provided 30 minutes of non-face-to-face time during this encounter.        11/25/2023 3:02 PM Crystal Marshall  MRN:  [id number]  Chief Complaint: This has been a long week.  HPI: 28 year old female seen today for follow-up psychiatric evaluation.  She has a history of bipolar 2 disorder, depression, and anxiety. She is currently prescribed hydroxyzine  10 mg three times daily as needed, Trazodone  50 mg nightly as needed and Luvox  100 mg. She notes that her medications are effective in managing her psychiatric conditions.  Patient was seen today via telehealth while sitting in her car during a work break, noting that the air conditioner was on and the car was actively running. She appeared well-groomed, pleasant, cooperative, and engaged in conversation. She reported that this has been a long week and that her anxiety and depression symptoms have somewhat increased. Despite this, she stated that she is able to cope and is functioning at work.  She was encouraged to consider therapy as an additional support and was counseled on healthy coping strategies, including focusing on eating to live rather than eating to cope with stress. She verbalized understanding and expressed willingness to work on these strategies, noting that she may consider counseling in the future.  She reported that her current psychiatric medications, fluvoxamine  (Luvox ) and trazodone , continue to be effective and that she does not desire  any medication changes at this time. She endorsed improved sleep since starting trazodone , despite a long commute impacting her overall rest. She also denied recent binge-eating behaviors and reported identifying alternative coping methods.  No new psychosocial stressors were identified. She denied suicidal ideation, homicidal ideation, auditory or visual hallucinations, paranoia, or manic symptoms.  Visit Diagnosis:    ICD-10-CM   1. Bipolar 2 disorder, major depressive episode (HCC)  F31.81     2. Generalized anxiety disorder  F41.1     3. Binge eating  R63.2       Past Psychiatric History: Anxiety and depression   Past Medical History:  Past Medical History:  Diagnosis Date   Anxiety    Depression     Past Surgical History:  Procedure Laterality Date   TUMOR EXCISION Right    benign tumor removal    Family Psychiatric History: Unknown  Family History:  Family History  Problem Relation Age of Onset   Hypertension Mother    Diabetes Father    Hypertension Father    Hyperlipidemia Father    Diabetes Other     Social History:  Social History   Socioeconomic History   Marital status: Media planner    Spouse name: Not on file   Number of children: Not on file   Years of education: Not on file   Highest education level: Not on file  Occupational History   Not on file  Tobacco Use   Smoking status: Never   Smokeless tobacco: Never  Vaping Use   Vaping status: Never Used  Substance and Sexual Activity   Alcohol use: Yes   Drug  use: Yes    Types: Marijuana   Sexual activity: Yes    Birth control/protection: Pill  Other Topics Concern   Not on file  Social History Narrative   Not on file   Social Drivers of Health   Financial Resource Strain: Not on file  Food Insecurity: Not on file  Transportation Needs: Not on file  Physical Activity: Not on file  Stress: Not on file  Social Connections: Unknown (09/06/2021)   Received from [employer]    Social Network    Social Network: Not on file    Allergies: No Known Allergies  Metabolic Disorder Labs: No results found for: HGBA1C, MPG No results found for: PROLACTIN No results found for: CHOL, TRIG, HDL, CHOLHDL, VLDL, LDLCALC No results found for: TSH  Therapeutic Level Labs: No results found for: LITHIUM No results found for: VALPROATE No results found for: CBMZ  Current Medications: Current Outpatient Medications  Medication Sig Dispense Refill   fluvoxaMINE  (LUVOX ) 100 MG tablet Take 1 tablet (100 mg total) by mouth daily. 30 tablet 3   hydrOXYzine  (ATARAX ) 10 MG tablet Take 1 tablet (10 mg total) by mouth 3 (three) times daily as needed. 90 tablet 3   ondansetron  (ZOFRAN  ODT) 8 MG disintegrating tablet Take 1 tablet (8 mg total) by mouth every 8 (eight) hours as needed for nausea or vomiting. 21 tablet 0   promethazine  (PHENERGAN ) 25 MG suppository Place 1 suppository (25 mg total) rectally every 6 (six) hours as needed for nausea or vomiting. 24 each 0   traZODone  (DESYREL ) 50 MG tablet Take 1-2 tablets (50-100 mg total) by mouth at bedtime as needed for sleep. 60 tablet 3   No current facility-administered medications for this visit.     Musculoskeletal: Strength & Muscle Tone: within normal limits and telehealth visit Gait & Station: normal, telehealth visit Patient leans: N/A   Psychiatric Specialty Exam: Review of Systems  All other systems reviewed and are negative.   There were no vitals taken for this visit.There is no height or weight on file to calculate BMI.  General Appearance: Well Groomed  Eye Contact:  Good  Speech:  Clear and Coherent and Normal Rate  Volume:  Normal  Mood:  Anxious, Depressed, and mild  Affect:  Appropriate and Congruent  Thought Process:  Coherent, Goal Directed and Linear  Orientation:  Full (Time, Place, and Person)  Thought Content: WDL and Logical   Suicidal Thoughts:  No  Homicidal Thoughts:   No  Memory:  Immediate;   Good Recent;   Good Remote;   Good  Judgement:  Good  Insight:  Good  Psychomotor Activity:  Normal  Concentration:  Concentration: Good and Attention Span: Good  Recall:  Good  Fund of Knowledge: Good  Language: Good  Akathisia:  No  Handed:  Right  AIMS (if indicated): Not done  Assets:  Communication Skills Desire for Improvement Financial Resources/Insurance Housing Social Support  ADL's:  Intact  Cognition: WNL  Sleep:  Fair    Assessment and Plan: Patient informed Clinical research associate that she has been binge eating to cope with increased anxiety and depression.  Provider recommended therapy.  Patient notes she will consider it in the future.  She informed Clinical research associate that she is able to cope with current stressors.  No medication changes made.  Patient agreeable to continue medication as prescribed.  1. Binge eating  Continue- hydrOXYzine  (ATARAX ) 10 MG tablet; Take 1 tablet (10 mg total) by mouth 3 (three) times daily  as needed.  Dispense: 90 tablet; Refill: 3 Continue- fluvoxaMINE  (LUVOX ) 100 MG tablet; Take 1 tablet (100 mg total) by mouth daily.  Dispense: 30 tablet; Refill: 3  2. Bipolar 2 disorder, major depressive episode (HCC)  Continue- traZODone  (DESYREL ) 50 MG tablet; Take 1-2 tablets (50-100 mg total) by mouth at bedtime as needed for sleep.  Dispense: 60 tablet; Refill: 3  3. Generalized anxiety disorder  Continue- hydrOXYzine  (ATARAX ) 10 MG tablet; Take 1 tablet (10 mg total) by mouth 3 (three) times daily as needed.  Dispense: 90 tablet; Refill: 3 Continue- fluvoxaMINE  (LUVOX ) 100 MG tablet; Take 1 tablet (100 mg total) by mouth daily.  Dispense: 30 tablet; Refill: 3 Continue- traZODone  (DESYREL ) 50 MG tablet; Take 1-2 tablets (50-100 mg total) by mouth at bedtime as needed for sleep.  Dispense: 60 tablet; Refill: 3   Follow up in 2.5 months Follow up with therapy   Majel GORMAN Ramp, FNP 11/25/2023, 3:02 PM

## 2023-12-02 ENCOUNTER — Telehealth (HOSPITAL_COMMUNITY): Admitting: Physician Assistant

## 2024-02-23 ENCOUNTER — Encounter (HOSPITAL_COMMUNITY): Payer: Self-pay | Admitting: Psychiatry

## 2024-02-23 ENCOUNTER — Telehealth (INDEPENDENT_AMBULATORY_CARE_PROVIDER_SITE_OTHER): Admitting: Psychiatry

## 2024-02-23 DIAGNOSIS — F411 Generalized anxiety disorder: Secondary | ICD-10-CM | POA: Diagnosis not present

## 2024-02-23 DIAGNOSIS — F3181 Bipolar II disorder: Secondary | ICD-10-CM

## 2024-02-23 DIAGNOSIS — R632 Polyphagia: Secondary | ICD-10-CM

## 2024-02-23 MED ORDER — FLUVOXAMINE MALEATE 100 MG PO TABS
100.0000 mg | ORAL_TABLET | Freq: Every day | ORAL | 3 refills | Status: DC
Start: 2024-02-23 — End: 2024-03-09

## 2024-02-23 MED ORDER — HYDROXYZINE HCL 10 MG PO TABS: 10.0000 mg | ORAL_TABLET | Freq: Three times a day (TID) | ORAL | 3 refills | Status: DC | PRN

## 2024-02-23 MED ORDER — CARIPRAZINE HCL 1.5 MG PO CAPS: 1.5000 mg | ORAL_CAPSULE | Freq: Every day | ORAL | 3 refills | Status: DC

## 2024-02-23 MED ORDER — TRAZODONE HCL 50 MG PO TABS
50.0000 mg | ORAL_TABLET | Freq: Every evening | ORAL | 3 refills | Status: DC | PRN
Start: 2024-02-23 — End: 2024-03-09

## 2024-02-23 NOTE — Progress Notes (Signed)
 BH MD/PA/NP OP Progress Note Virtual Visit via Video Note  I connected with [name] on 02/23/24 at  2:00 PM EDT by a video enabled telemedicine application and verified that I am speaking with the correct person using two identifiers.  Location: Patient: Crystal Marshall Provider: Economist   I discussed the limitations of evaluation and management by telemedicine and the availability of in person appointments. The patient expressed understanding and agreed to proceed.  I provided 30 minutes of non-face-to-face time during this encounter.        02/23/2024 11:52 AM Crystal Marshall  MRN:  [id number]  Chief Complaint: I crashed out last night.  HPI: 28 year old female seen today for follow-up psychiatric evaluation.  She has a history of bipolar 2 disorder, depression, and anxiety. She is currently prescribed hydroxyzine  10 mg three times daily as needed, Trazodone  50 mg nightly as needed and Luvox  100 mg. She notes that her medications are somewhat effective in managing her psychiatric conditions.  Today she was well groomed, pleasant, cooperative, and engaged in conversation.  She informed clinical research associate that she crashed out last night. She notes that she has been trying to please people and has not been caring for her self. She notes that she continues to be stressed at [facility] [employer]. She also notes that her mother will be moving in with her for a month to save for her own apartment.   Today patient notes that at times she gets angry. She notes that she also has fluctuations in mood. She notes that she feels not good enough and been more tearful. At times she notes that she is distracted and has road rage. At times she notes that she drives fast. Recently she notes that her boss talked to her about her distractibility.  She reports that she is trying better to focus so that she will not lose her job.  Patient has tried Abilify  in the past but discontinued it.  She also tried Lamictal  but  would forget to take her pill.  Provider discussed potentially trialing Vraylar or Caplyta to help manage hypomanic state.  She reports that she will try Vraylar.    Patient reports the above exacerbates her anxiety and depression.  Today provider conducted a GAD-7 and patient scored 14.Provider also conducted PHQ-9 and patient scored a 12.  She reports sleeping 6 to 10 hours nightly.  Patient continues to cope with stressors by binge eating.  She denies purging behaviors. Today she denies SI/HI/VAH or paranoia.   To cope with above patient notes that she took Robitussin for 2 weeks.  She informed clinical research associate that it helped her disassociate from her above stressors.  Provider informed patient that Robitussin should be used for cough suppressants and not mental health care.  She endorsed understanding and notes that she stopped it as it caused her to be tense sweating.   Today patient agreeable to starting Vraylar 1.5 mg to help manage mood.  Potential side effects of medication and risks vs benefits of treatment vs non-treatment were explained and discussed. All questions were answered.  She will continue her other medications as prescribed.  No other concerns noted at this time.  Visit Diagnosis:    ICD-10-CM   1. Generalized anxiety disorder  F41.1 hydrOXYzine  (ATARAX ) 10 MG tablet    fluvoxaMINE  (LUVOX ) 100 MG tablet    traZODone  (DESYREL ) 50 MG tablet    2. Binge eating  R63.2 hydrOXYzine  (ATARAX ) 10 MG tablet    fluvoxaMINE  (LUVOX )  100 MG tablet    3. Bipolar 2 disorder, major depressive episode (HCC)  F31.81 traZODone  (DESYREL ) 50 MG tablet    cariprazine (VRAYLAR) 1.5 MG capsule            Past Psychiatric History: Anxiety and depression   Past Medical History:  Past Medical History:  Diagnosis Date   Anxiety    Depression     Past Surgical History:  Procedure Laterality Date   TUMOR EXCISION Right    benign tumor removal    Family Psychiatric History: Unknown  Family  History:  Family History  Problem Relation Age of Onset   Hypertension Mother    Diabetes Father    Hypertension Father    Hyperlipidemia Father    Diabetes Other     Social History:  Social History   Socioeconomic History   Marital status: Media Planner    Spouse name: Not on file   Number of children: Not on file   Years of education: Not on file   Highest education level: Not on file  Occupational History   Not on file  Tobacco Use   Smoking status: Never   Smokeless tobacco: Never  Vaping Use   Vaping status: Never Used  Substance and Sexual Activity   Alcohol use: Yes   Drug use: Yes    Types: Marijuana   Sexual activity: Yes    Birth control/protection: Pill  Other Topics Concern   Not on file  Social History Narrative   Not on file   Social Drivers of Health   Financial Resource Strain: Not on file  Food Insecurity: Not on file  Transportation Needs: Not on file  Physical Activity: Not on file  Stress: Not on file  Social Connections: Unknown (09/06/2021)   Received from [employer]   Social Network    Social Network: Not on file    Allergies: No Known Allergies  Metabolic Disorder Labs: No results found for: HGBA1C, MPG No results found for: PROLACTIN No results found for: CHOL, TRIG, HDL, CHOLHDL, VLDL, LDLCALC No results found for: TSH  Therapeutic Level Labs: No results found for: LITHIUM No results found for: VALPROATE No results found for: CBMZ  Current Medications: Current Outpatient Medications  Medication Sig Dispense Refill   cariprazine (VRAYLAR) 1.5 MG capsule Take 1 capsule (1.5 mg total) by mouth daily. 30 capsule 3   fluvoxaMINE  (LUVOX ) 100 MG tablet Take 1 tablet (100 mg total) by mouth daily. 30 tablet 3   hydrOXYzine  (ATARAX ) 10 MG tablet Take 1 tablet (10 mg total) by mouth 3 (three) times daily as needed. 90 tablet 3   ondansetron  (ZOFRAN  ODT) 8 MG disintegrating tablet Take 1 tablet (8 mg  total) by mouth every 8 (eight) hours as needed for nausea or vomiting. 21 tablet 0   promethazine  (PHENERGAN ) 25 MG suppository Place 1 suppository (25 mg total) rectally every 6 (six) hours as needed for nausea or vomiting. 24 each 0   traZODone  (DESYREL ) 50 MG tablet Take 1-2 tablets (50-100 mg total) by mouth at bedtime as needed for sleep. 60 tablet 3   No current facility-administered medications for this visit.     Musculoskeletal: Strength & Muscle Tone: within normal limits and telehealth visit Gait & Station: normal, telehealth visit Patient leans: N/A   Psychiatric Specialty Exam: Review of Systems  There were no vitals taken for this visit.There is no height or weight on file to calculate BMI.  General Appearance: Well Groomed  Eye Contact:  Good  Speech:  Clear and Coherent and Normal Rate  Volume:  Normal  Mood:  Anxious and Depressed  Affect:  Appropriate and Congruent  Thought Process:  Coherent, Goal Directed and Linear  Orientation:  Full (Time, Place, and Person)  Thought Content: WDL and Logical   Suicidal Thoughts:  No  Homicidal Thoughts:  No  Memory:  Immediate;   Good Recent;   Good Remote;   Good  Judgement:  Good  Insight:  Good  Psychomotor Activity:  Normal  Concentration:  Concentration: Good and Attention Span: Good  Recall:  Good  Fund of Knowledge: Good  Language: Good  Akathisia:  No  Handed:  Right  AIMS (if indicated): Not done  Assets:  Communication Skills Desire for Improvement Financial Resources/Insurance Housing Social Support  ADL's:  Intact  Cognition: WNL  Sleep:  Fair    Assessment and Plan: Patient endorses symptoms of hypomania, anxiety, and depression.Today patient agreeable to starting Vraylar 1.5 mg to help manage mood.  She will continue her other medications as prescribed.  1. Generalized anxiety disorder  Continue- hydrOXYzine  (ATARAX ) 10 MG tablet; Take 1 tablet (10 mg total) by mouth 3 (three) times daily as  needed.  Dispense: 90 tablet; Refill: 3 Continue- fluvoxaMINE  (LUVOX ) 100 MG tablet; Take 1 tablet (100 mg total) by mouth daily.  Dispense: 30 tablet; Refill: 3 Continue- traZODone  (DESYREL ) 50 MG tablet; Take 1-2 tablets (50-100 mg total) by mouth at bedtime as needed for sleep.  Dispense: 60 tablet; Refill: 3  2. Binge eating  Continue- hydrOXYzine  (ATARAX ) 10 MG tablet; Take 1 tablet (10 mg total) by mouth 3 (three) times daily as needed.  Dispense: 90 tablet; Refill: 3 Continue- fluvoxaMINE  (LUVOX ) 100 MG tablet; Take 1 tablet (100 mg total) by mouth daily.  Dispense: 30 tablet; Refill: 3  3. Bipolar 2 disorder, major depressive episode (HCC)  Continue- traZODone  (DESYREL ) 50 MG tablet; Take 1-2 tablets (50-100 mg total) by mouth at bedtime as needed for sleep.  Dispense: 60 tablet; Refill: 3 Start- cariprazine (VRAYLAR) 1.5 MG capsule; Take 1 capsule (1.5 mg total) by mouth daily.  Dispense: 30 capsule; Refill: 3    Follow up in 1.5 months Follow up with therapy     Zane FORBES Bach, NP 02/23/2024, 11:52 AM

## 2024-03-09 ENCOUNTER — Telehealth (INDEPENDENT_AMBULATORY_CARE_PROVIDER_SITE_OTHER): Admitting: Psychiatry

## 2024-03-09 ENCOUNTER — Encounter (HOSPITAL_COMMUNITY): Payer: Self-pay | Admitting: Psychiatry

## 2024-03-09 DIAGNOSIS — R632 Polyphagia: Secondary | ICD-10-CM

## 2024-03-09 DIAGNOSIS — F3181 Bipolar II disorder: Secondary | ICD-10-CM

## 2024-03-09 DIAGNOSIS — F411 Generalized anxiety disorder: Secondary | ICD-10-CM | POA: Diagnosis not present

## 2024-03-09 MED ORDER — FLUVOXAMINE MALEATE 100 MG PO TABS: 100.0000 mg | ORAL_TABLET | Freq: Every day | ORAL | 3 refills | Status: DC

## 2024-03-09 MED ORDER — HYDROXYZINE HCL 10 MG PO TABS: 10.0000 mg | ORAL_TABLET | Freq: Three times a day (TID) | ORAL | 3 refills | Status: DC | PRN

## 2024-03-09 MED ORDER — TRAZODONE HCL 50 MG PO TABS: 50.0000 mg | ORAL_TABLET | Freq: Every evening | ORAL | 3 refills | Status: DC | PRN

## 2024-03-09 NOTE — Progress Notes (Signed)
 BH MD/PA/NP OP Progress Note Virtual Visit via Video Note  I connected with Crystal Marshall on 03/09/24 at  3:30 PM EST by a video enabled telemedicine application and verified that I am speaking with the correct person using two identifiers.  Location: Patient: Work Provider: Economist   I discussed the limitations of evaluation and management by telemedicine and the availability of in person appointments. The patient expressed understanding and agreed to proceed.  I provided 30 minutes of non-face-to-face time during this encounter.        03/09/2024 9:00 AM Crystal Marshall  MRN:  [id number]  Chief Complaint: I have not started the vraylar.  HPI: 28 year old female seen today for follow-up psychiatric evaluation.  She has a history of bipolar 2 disorder, depression, and anxiety. She is currently prescribed vraylar 1.5 mg, hydroxyzine  10 mg three times daily as needed, Trazodone  50 mg nightly as needed and Luvox  100 mg. She notes that she has not started vraylar and finds her other medications effective in managing her psychiatric conditions.  Today patient reports that she is feeling better.  She inform her that at her last visit she was on her menstrual cycle and notes that her mood was fluctuating and she was more irritable.  She informed clinical research associate that she was unable to start Vraylar because she is uninsured.  Patient notes that at this time she does not feel that she needs it as she feels mentally stable.  She reports her anxiety and depression are minimal.  Today provider conducted GAD-7 and patient scored an 8, at her last visit she scored a 14.  Provider also conducted PHQ-9 patient scored an 8, at her last visit she scored a 12.  Today she denies SI/HI/VAH or paranoia.   At this time she does not wish to restart Vraylar.  She notes that she will consider it in the future if her mood begins to decline.  She will continue all her other medications as prescribed.  No  other concerns noted at this time.  Visit Diagnosis:    ICD-10-CM   1. Generalized anxiety disorder  F41.1 hydrOXYzine  (ATARAX ) 10 MG tablet    fluvoxaMINE  (LUVOX ) 100 MG tablet    traZODone  (DESYREL ) 50 MG tablet    2. Binge eating  R63.2 hydrOXYzine  (ATARAX ) 10 MG tablet    fluvoxaMINE  (LUVOX ) 100 MG tablet    3. Bipolar 2 disorder, major depressive episode (HCC)  F31.81 traZODone  (DESYREL ) 50 MG tablet             Past Psychiatric History: Anxiety and depression   Past Medical History:  Past Medical History:  Diagnosis Date   Anxiety    Depression     Past Surgical History:  Procedure Laterality Date   TUMOR EXCISION Right    benign tumor removal    Family Psychiatric History: Unknown  Family History:  Family History  Problem Relation Age of Onset   Hypertension Mother    Diabetes Father    Hypertension Father    Hyperlipidemia Father    Diabetes Other     Social History:  Social History   Socioeconomic History   Marital status: Media Planner    Spouse name: Not on file   Number of children: Not on file   Years of education: Not on file   Highest education level: Not on file  Occupational History   Not on file  Tobacco Use   Smoking status: Never   Smokeless tobacco:  Never  Vaping Use   Vaping status: Never Used  Substance and Sexual Activity   Alcohol use: Yes   Drug use: Yes    Types: Marijuana   Sexual activity: Yes    Birth control/protection: Pill  Other Topics Concern   Not on file  Social History Narrative   Not on file   Social Drivers of Health   Financial Resource Strain: Not on file  Food Insecurity: Not on file  Transportation Needs: Not on file  Physical Activity: Not on file  Stress: Not on file  Social Connections: Not on file    Allergies: No Known Allergies  Metabolic Disorder Labs: No results found for: HGBA1C, MPG No results found for: PROLACTIN No results found for: CHOL, TRIG, HDL,  CHOLHDL, VLDL, LDLCALC No results found for: TSH  Therapeutic Level Labs: No results found for: LITHIUM No results found for: VALPROATE No results found for: CBMZ  Current Medications: Current Outpatient Medications  Medication Sig Dispense Refill   fluvoxaMINE  (LUVOX ) 100 MG tablet Take 1 tablet (100 mg total) by mouth daily. 30 tablet 3   hydrOXYzine  (ATARAX ) 10 MG tablet Take 1 tablet (10 mg total) by mouth 3 (three) times daily as needed. 90 tablet 3   ondansetron  (ZOFRAN  ODT) 8 MG disintegrating tablet Take 1 tablet (8 mg total) by mouth every 8 (eight) hours as needed for nausea or vomiting. 21 tablet 0   promethazine  (PHENERGAN ) 25 MG suppository Place 1 suppository (25 mg total) rectally every 6 (six) hours as needed for nausea or vomiting. 24 each 0   traZODone  (DESYREL ) 50 MG tablet Take 1-2 tablets (50-100 mg total) by mouth at bedtime as needed for sleep. 60 tablet 3   No current facility-administered medications for this visit.     Musculoskeletal: Strength & Muscle Tone: within normal limits and telehealth visit Gait & Station: normal, telehealth visit Patient leans: N/A   Psychiatric Specialty Exam: Review of Systems  There were no vitals taken for this visit.There is no height or weight on file to calculate BMI.  General Appearance: Well Groomed  Eye Contact:  Good  Speech:  Clear and Coherent and Normal Rate  Volume:  Normal  Mood:  Euthymic  Affect:  Appropriate and Congruent  Thought Process:  Coherent, Goal Directed and Linear  Orientation:  Full (Time, Place, and Person)  Thought Content: WDL and Logical   Suicidal Thoughts:  No  Homicidal Thoughts:  No  Memory:  Immediate;   Good Recent;   Good Remote;   Good  Judgement:  Good  Insight:  Good  Psychomotor Activity:  Normal  Concentration:  Concentration: Good and Attention Span: Good  Recall:  Good  Fund of Knowledge: Good  Language: Good  Akathisia:  No  Handed:  Right  AIMS (if  indicated): Not done  Assets:  Communication Skills Desire for Improvement Financial Resources/Insurance Housing Social Support  ADL's:  Intact  Cognition: WNL  Sleep:  Good    Assessment and Plan: Patient reports that her mood, anxiety, and depression are well managed. At this time she does not wish to restart Vraylar.  She notes that she will consider it in the future if her mood begins to decline.  She will continue all her other medications as prescribed.  1. Generalized anxiety disorder  Continue- hydrOXYzine  (ATARAX ) 10 MG tablet; Take 1 tablet (10 mg total) by mouth 3 (three) times daily as needed.  Dispense: 90 tablet; Refill: 3 Continue- fluvoxaMINE  (LUVOX ) 100 MG  tablet; Take 1 tablet (100 mg total) by mouth daily.  Dispense: 30 tablet; Refill: 3 Continue- traZODone  (DESYREL ) 50 MG tablet; Take 1-2 tablets (50-100 mg total) by mouth at bedtime as needed for sleep.  Dispense: 60 tablet; Refill: 3  2. Binge eating  Continue- hydrOXYzine  (ATARAX ) 10 MG tablet; Take 1 tablet (10 mg total) by mouth 3 (three) times daily as needed.  Dispense: 90 tablet; Refill: 3 Continue- fluvoxaMINE  (LUVOX ) 100 MG tablet; Take 1 tablet (100 mg total) by mouth daily.  Dispense: 30 tablet; Refill: 3  3. Bipolar 2 disorder, major depressive episode (HCC)  Continue- traZODone  (DESYREL ) 50 MG tablet; Take 1-2 tablets (50-100 mg total) by mouth at bedtime as needed for sleep.  Dispense: 60 tablet; Refill: 3     Follow up in 1.5 months Follow up with therapy     Zane FORBES Bach, NP 03/09/2024, 9:00 AM

## 2024-05-31 ENCOUNTER — Encounter (HOSPITAL_COMMUNITY): Payer: Self-pay | Admitting: Psychiatry

## 2024-05-31 ENCOUNTER — Telehealth (INDEPENDENT_AMBULATORY_CARE_PROVIDER_SITE_OTHER): Admitting: Psychiatry

## 2024-05-31 DIAGNOSIS — F3181 Bipolar II disorder: Secondary | ICD-10-CM | POA: Diagnosis not present

## 2024-05-31 DIAGNOSIS — F411 Generalized anxiety disorder: Secondary | ICD-10-CM | POA: Diagnosis not present

## 2024-05-31 DIAGNOSIS — R632 Polyphagia: Secondary | ICD-10-CM

## 2024-05-31 MED ORDER — TRAZODONE HCL 50 MG PO TABS: 50.0000 mg | ORAL_TABLET | Freq: Every evening | ORAL | 3 refills | Status: AC | PRN

## 2024-05-31 MED ORDER — HYDROXYZINE HCL 10 MG PO TABS: 10.0000 mg | ORAL_TABLET | Freq: Three times a day (TID) | ORAL | 3 refills | Status: AC | PRN

## 2024-05-31 MED ORDER — FLUVOXAMINE MALEATE 100 MG PO TABS: 100.0000 mg | ORAL_TABLET | Freq: Every day | ORAL | 3 refills | Status: AC
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
## 2024-08-09 ENCOUNTER — Telehealth (HOSPITAL_COMMUNITY): Admitting: Psychiatry
